# Patient Record
Sex: Female | Born: 1995
Health system: Southern US, Community
[De-identification: ages and names within clinical notes are randomized; demographics above are authoritative.]

## PROBLEM LIST (undated history)

## (undated) DIAGNOSIS — F419 Anxiety disorder, unspecified: Secondary | ICD-10-CM

## (undated) DIAGNOSIS — G43909 Migraine, unspecified, not intractable, without status migrainosus: Secondary | ICD-10-CM

## (undated) HISTORY — DX: Migraine, unspecified, not intractable, without status migrainosus: G43.909

## (undated) HISTORY — DX: Anxiety disorder, unspecified: F41.9

---
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
## 2016-10-27 ENCOUNTER — Encounter: Payer: Self-pay | Admitting: Certified Nurse Midwife

## 2016-10-27 ENCOUNTER — Ambulatory Visit (INDEPENDENT_AMBULATORY_CARE_PROVIDER_SITE_OTHER): Payer: Commercial Managed Care - HMO | Admitting: Certified Nurse Midwife

## 2016-10-27 VITALS — BP 130/81 | HR 107 | Ht 65.0 in | Wt 143.2 lb

## 2016-10-27 DIAGNOSIS — Z3041 Encounter for surveillance of contraceptive pills: Secondary | ICD-10-CM

## 2016-10-27 DIAGNOSIS — Z7689 Persons encountering health services in other specified circumstances: Secondary | ICD-10-CM

## 2016-10-27 MED ORDER — NORETHIN ACE-ETH ESTRAD-FE 1-20 MG-MCG PO TABS
1.0000 | ORAL_TABLET | Freq: Every day | ORAL | 3 refills | Status: DC
Start: 2016-10-27 — End: 2017-01-25

## 2016-10-27 NOTE — Patient Instructions (Signed)
Oral Contraception Use Oral contraceptive pills (OCPs) are medicines taken to prevent pregnancy. OCPs work by preventing the ovaries from releasing eggs. The hormones in OCPs also cause the cervical mucus to thicken, preventing the sperm from entering the uterus. The hormones also cause the uterine lining to become thin, not allowing a fertilized egg to attach to the inside of the uterus. OCPs are highly effective when taken exactly as prescribed. However, OCPs do not prevent sexually transmitted diseases (STDs). Safe sex practices, such as using condoms along with an OCP, can help prevent STDs. Before taking OCPs, you may have a physical exam and Pap test. Your health care provider may also order blood tests if necessary. Your health care provider will make sure you are a good candidate for oral contraception. Discuss with your health care provider the possible side effects of the OCP you may be prescribed. When starting an OCP, it can take 2 to 3 months for the body to adjust to the changes in hormone levels in your body. How to take oral contraceptive pills Your health care provider may advise you on how to start taking the first cycle of OCPs. Otherwise, you can:  Start on day 1 of your menstrual period. You will not need any backup contraceptive protection with this start time.  Start on the first Sunday after your menstrual period or the day you get your prescription. In these cases, you will need to use backup contraceptive protection for the first week.  Start the pill at any time of your cycle. If you take the pill within 5 days of the start of your period, you are protected against pregnancy right away. In this case, you will not need a backup form of birth control. If you start at any other time of your menstrual cycle, you will need to use another form of birth control for 7 days. If your OCP is the type called a minipill, it will protect you from pregnancy after taking it for 2 days (48  hours).  After you have started taking OCPs:  If you forget to take 1 pill, take it as soon as you remember. Take the next pill at the regular time.  If you miss 2 or more pills, call your health care provider because different pills have different instructions for missed doses. Use backup birth control until your next menstrual period starts.  If you use a 28-day pack that contains inactive pills and you miss 1 of the last 7 pills (pills with no hormones), it will not matter. Throw away the rest of the non-hormone pills and start a new pill pack.  No matter which day you start the OCP, you will always start a new pack on that same day of the week. Have an extra pack of OCPs and a backup contraceptive method available in case you miss some pills or lose your OCP pack. Follow these instructions at home:  Do not smoke.  Always use a condom to protect against STDs. OCPs do not protect against STDs.  Use a calendar to mark your menstrual period days.  Read the information and directions that came with your OCP. Talk to your health care provider if you have questions. Contact a health care provider if:  You develop nausea and vomiting.  You have abnormal vaginal discharge or bleeding.  You develop a rash.  You miss your menstrual period.  You are losing your hair.  You need treatment for mood swings or depression.  You   get dizzy when taking the OCP.  You develop acne from taking the OCP.  You become pregnant. Get help right away if:  You develop chest pain.  You develop shortness of breath.  You have an uncontrolled or severe headache.  You develop numbness or slurred speech.  You develop visual problems.  You develop pain, redness, and swelling in the legs. This information is not intended to replace advice given to you by your health care provider. Make sure you discuss any questions you have with your health care provider. Document Released: 09/28/2011 Document  Revised: 03/16/2016 Document Reviewed: 03/30/2013 Elsevier Interactive Patient Education  2017 Elsevier Inc.  

## 2016-10-27 NOTE — Progress Notes (Signed)
  GYN ENCOUNTER NOTE  Subjective:       Stacey Harrison is a 21 y.o. female is here for birth control refill.   Stacey Harrison graduated from [employer] with a degree in Elementary education in May 2017.   While a student and immediately prior to graduation, she received her care at the on campus health center.   Taking Junel Fe 1/20 and "likes her pill".    Gynecologic History Patient's last menstrual period was 09/26/2016 (exact date).   Contraception: OCP (estrogen/progesterone)   Last Pap: 01/2016. Results were: normal   Obstetric History OB History  No data available    History reviewed. No pertinent past medical history.  Past Surgical History:  Procedure Laterality Date  . [place] EXTRACTION  2014    No current outpatient prescriptions on file prior to visit.   No current facility-administered medications on file prior to visit.     Not on File  Social History   Social History  . Marital status: Married    Spouse name: N/A  . Number of children: N/A  . Years of education: N/A   Occupational History  . Not on file.   Social History Main Topics  . Smoking status: Never Smoker  . Smokeless tobacco: Never Used  . Alcohol use Yes     Comment: rarely  . Drug use: No  . Sexual activity: Yes    Birth control/ protection: Pill   Other Topics Concern  . Not on file   Social History Narrative  . No narrative on file    Family History  Problem Relation Age of Onset  . Breast cancer Maternal Grandmother     The following portions of the patient's history were reviewed and updated as appropriate: allergies, current medications, past family history, past medical history, past social history, past surgical history and problem list.  Review of Systems Review of Systems - negative  Objective:   BP 130/81   Pulse (!) 107   Ht 5\' 5"  (1.651 m)   Wt 143 lb 3 oz (64.9 kg)   LMP 09/26/2016 (Exact Date)   BMI 23.83 kg/m    NECK: Normal range of  motion, supple, no masses.  Normal thyroid.   CARDIOVASCULAR: Regular rate and rhythm   RESPIRATORY: Clear bilaterally  Assessment:   1. Encounter for surveillance of contraceptive pills   2. Encounter to establish care    Plan:   1.  Reviewed risk and benefits of OCPs and red flag symptoms like abdominal pain, chest pain, headache, eye problems, or severe leg pain and when to notify.   2. Refill Junel 1/20 Fe, 1 tablet PO daily, refills 3  3. RTC in April 2018 for AE or sooner if needed.    Diona Fanti, CNM  ROS

## 2017-01-25 ENCOUNTER — Encounter: Payer: Self-pay | Admitting: Certified Nurse Midwife

## 2017-01-25 ENCOUNTER — Ambulatory Visit (INDEPENDENT_AMBULATORY_CARE_PROVIDER_SITE_OTHER): Payer: Commercial Managed Care - HMO | Admitting: Certified Nurse Midwife

## 2017-01-25 VITALS — BP 124/81 | HR 91 | Ht 66.5 in | Wt 143.9 lb

## 2017-01-25 DIAGNOSIS — Z01419 Encounter for gynecological examination (general) (routine) without abnormal findings: Secondary | ICD-10-CM | POA: Diagnosis not present

## 2017-01-25 MED ORDER — NORETHIN ACE-ETH ESTRAD-FE 1-20 MG-MCG PO TABS: 1.0000 | ORAL_TABLET | Freq: Every day | ORAL | 3 refills | Status: DC

## 2017-01-25 NOTE — Patient Instructions (Signed)
Preventive Care 18-39 Years, Female Preventive care refers to lifestyle choices and visits with your health care provider that can promote health and wellness. What does preventive care include?  A yearly physical exam. This is also called an annual well check.  Dental exams once or twice a year.  Routine eye exams. Ask your health care provider how often you should have your eyes checked.  Personal lifestyle choices, including:  Daily care of your teeth and gums.  Regular physical activity.  Eating a healthy diet.  Avoiding tobacco and drug use.  Limiting alcohol use.  Practicing safe sex.  Taking vitamin and mineral supplements as recommended by your health care provider. What happens during an annual well check? The services and screenings done by your health care provider during your annual well check will depend on your age, overall health, lifestyle risk factors, and family history of disease. Counseling  Your health care provider may ask you questions about your:  Alcohol use.  Tobacco use.  Drug use.  Emotional well-being.  Home and relationship well-being.  Sexual activity.  Eating habits.  Work and work environment.  Method of birth control.  Menstrual cycle.  Pregnancy history. Screening  You may have the following tests or measurements:  Height, weight, and BMI.  Diabetes screening. This is done by checking your blood sugar (glucose) after you have not eaten for a while (fasting).  Blood pressure.  Lipid and cholesterol levels. These may be checked every 5 years starting at age 20.  Skin check.  Hepatitis C blood test.  Hepatitis B blood test.  Sexually transmitted disease (STD) testing.  BRCA-related cancer screening. This may be done if you have a family history of breast, ovarian, tubal, or peritoneal cancers.  Pelvic exam and Pap test. This may be done every 3 years starting at age 21. Starting at age 30, this may be done every 5  years if you have a Pap test in combination with an HPV test. Discuss your test results, treatment options, and if necessary, the need for more tests with your health care provider. Vaccines  Your health care provider may recommend certain vaccines, such as:  Influenza vaccine. This is recommended every year.  Tetanus, diphtheria, and acellular pertussis (Tdap, Td) vaccine. You may need a Td booster every 10 years.  Varicella vaccine. You may need this if you have not been vaccinated.  HPV vaccine. If you are 26 or younger, you may need three doses over 6 months.  Measles, mumps, and rubella (MMR) vaccine. You may need at least one dose of MMR. You may also need a second dose.  Pneumococcal 13-valent conjugate (PCV13) vaccine. You may need this if you have certain conditions and were not previously vaccinated.  Pneumococcal polysaccharide (PPSV23) vaccine. You may need one or two doses if you smoke cigarettes or if you have certain conditions.  Meningococcal vaccine. One dose is recommended if you are age 19-21 years and a first-year college student living in a residence hall, or if you have one of several medical conditions. You may also need additional booster doses.  Hepatitis A vaccine. You may need this if you have certain conditions or if you travel or work in places where you may be exposed to hepatitis A.  Hepatitis B vaccine. You may need this if you have certain conditions or if you travel or work in places where you may be exposed to hepatitis B.  Haemophilus influenzae type b (Hib) vaccine. You may need this   you have certain risk factors.  Talk to your health care provider about which screenings and vaccines you need and how often you need them. This information is not intended to replace advice given to you by your health care provider. Make sure you discuss any questions you have with your health care provider. Document Released: 12/05/2001 Document Revised: 06/28/2016  Document Reviewed: 08/10/2015 Elsevier Interactive Patient Education  2017 Elsevier Inc.  Ethinyl Estradiol; Norethindrone Acetate; Ferrous fumarate tablets or capsules What is this medicine? ETHINYL ESTRADIOL; NORETHINDRONE ACETATE; FERROUS FUMARATE (ETH in il es tra DYE ole; nor eth IN drone AS e tate; FER us FUE ma rate) is an oral contraceptive. The products combine two types of female hormones, an estrogen and a progestin. They are used to prevent ovulation and pregnancy. Some products are also used to treat acne in females. This medicine may be used for other purposes; ask your health care provider or pharmacist if you have questions. COMMON BRAND NAME(S): Blisovi 24 Fe, Blisovi Fe, Estrostep Fe, Gildess 24 Fe, Gildess Fe 1.5/30, Gildess Fe 1/20, Junel Fe 1.5/30, Junel Fe 1/20, Junel Fe 24, Larin Fe, Lo Loestrin Fe, Loestrin 24 Fe, Loestrin FE 1.5/30, Loestrin FE 1/20, Lomedia 24 Fe, Microgestin 24 Fe, Microgestin Fe 1.5/30, Microgestin Fe 1/20, Tarina Fe 1/20, Taytulla, Tilia Fe, Tri-Legest Fe What should I tell my health care provider before I take this medicine? They need to know if you have any of these conditions: -abnormal vaginal bleeding -blood vessel disease -breast, cervical, endometrial, ovarian, liver, or uterine cancer -diabetes -gallbladder disease -heart disease or recent heart attack -high blood pressure -high cholesterol -history of blood clots -kidney disease -liver disease -migraine headaches -smoke tobacco -stroke -systemic lupus erythematosus (SLE) -an unusual or allergic reaction to estrogens, progestins, other medicines, foods, dyes, or preservatives -pregnant or trying to get pregnant -breast-feeding How should I use this medicine? Take this medicine by mouth. To reduce nausea, this medicine may be taken with food. Follow the directions on the prescription label. Take this medicine at the same time each day and in the order directed on the package. Do not  take your medicine more often than directed. A patient package insert for the product will be given with each prescription and refill. Read this sheet carefully each time. The sheet may change frequently. Contact your pediatrician regarding the use of this medicine in children. Special care may be needed. This medicine has been used in female children who have started having menstrual periods. Overdosage: If you think you have taken too much of this medicine contact a poison control center or emergency room at once. NOTE: This medicine is only for you. Do not share this medicine with others. What if I miss a dose? If you miss a dose, refer to the patient information sheet you received with your medicine for direction. If you miss more than one pill, this medicine may not be as effective and you may need to use another form of birth control. What may interact with this medicine? Do not take this medicine with the following medication: -dasabuvir; ombitasvir; paritaprevir; ritonavir -ombitasvir; paritaprevir; ritonavir This medicine may also interact with the following medications: -acetaminophen -antibiotics or medicines for infections, especially rifampin, rifabutin, rifapentine, and griseofulvin, and possibly penicillins or tetracyclines -aprepitant -ascorbic acid (vitamin C) -atorvastatin -barbiturate medicines, such as phenobarbital -bosentan -carbamazepine -caffeine -clofibrate -cyclosporine -dantrolene -doxercalciferol -felbamate -grapefruit juice -hydrocortisone -medicines for anxiety or sleeping problems, such as diazepam or temazepam -medicines for diabetes, including pioglitazone -mineral   oil -modafinil -mycophenolate -nefazodone -oxcarbazepine -phenytoin -prednisolone -ritonavir or other medicines for HIV infection or AIDS -rosuvastatin -selegiline -soy isoflavones supplements -St. John's wort -tamoxifen or raloxifene -theophylline -thyroid  hormones -topiramate -warfarin This list may not describe all possible interactions. Give your health care provider a list of all the medicines, herbs, non-prescription drugs, or dietary supplements you use. Also tell them if you smoke, drink alcohol, or use illegal drugs. Some items may interact with your medicine. What should I watch for while using this medicine? Visit your doctor or health care professional for regular checks on your progress. You will need a regular breast and pelvic exam and Pap smear while on this medicine. Use an additional method of contraception during the first cycle that you take these tablets. If you have any reason to think you are pregnant, stop taking this medicine right away and contact your doctor or health care professional. If you are taking this medicine for hormone related problems, it may take several cycles of use to see improvement in your condition. Smoking increases the risk of getting a blood clot or having a stroke while you are taking birth control pills, especially if you are more than 23 years old. You are strongly advised not to smoke. This medicine can make your body retain fluid, making your fingers, hands, or ankles swell. Your blood pressure can go up. Contact your doctor or health care professional if you feel you are retaining fluid. This medicine can make you more sensitive to the sun. Keep out of the sun. If you cannot avoid being in the sun, wear protective clothing and use sunscreen. Do not use sun lamps or tanning beds/booths. If you wear contact lenses and notice visual changes, or if the lenses begin to feel uncomfortable, consult your eye care specialist. In some women, tenderness, swelling, or minor bleeding of the gums may occur. Notify your dentist if this happens. Brushing and flossing your teeth regularly may help limit this. See your dentist regularly and inform your dentist of the medicines you are taking. If you are going to have  elective surgery, you may need to stop taking this medicine before the surgery. Consult your health care professional for advice. This medicine does not protect you against HIV infection (AIDS) or any other sexually transmitted diseases. What side effects may I notice from receiving this medicine? Side effects that you should report to your doctor or health care professional as soon as possible: -allergic reactions like skin rash, itching or hives, swelling of the face, lips, or tongue -breast tissue changes or discharge -changes in vaginal bleeding during your period or between your periods -changes in vision -chest pain -confusion -coughing up blood -dizziness -feeling faint or lightheaded -headaches or migraines -leg, arm or groin pain -loss of balance or coordination -severe or sudden headaches -stomach pain (severe) -sudden shortness of breath -sudden numbness or weakness of the face, arm or leg -symptoms of vaginal infection like itching, irritation or unusual discharge -tenderness in the upper abdomen -trouble speaking or understanding -vomiting -yellowing of the eyes or skin Side effects that usually do not require medical attention (report to your doctor or health care professional if they continue or are bothersome): -breakthrough bleeding and spotting that continues beyond the 3 initial cycles of pills -breast tenderness -mood changes, anxiety, depression, frustration, anger, or emotional outbursts -increased sensitivity to sun or ultraviolet light -nausea -skin rash, acne, or brown spots on the skin -weight gain (slight) This list may not describe all   possible side effects. Call your doctor for medical advice about side effects. You may report side effects to FDA at 1-800-FDA-1088. Where should I keep my medicine? Keep out of the reach of children. Store at room temperature between 15 and 30 degrees C (59 and 86 degrees F). Throw away any unused medicine after the  expiration date. NOTE: This sheet is a summary. It may not cover all possible information. If you have questions about this medicine, talk to your doctor, pharmacist, or health care provider.  2018 Elsevier/Gold Standard (2016-06-19 08:04:41)  

## 2017-01-25 NOTE — Progress Notes (Signed)
ANNUAL PREVENTATIVE CARE GYN  ENCOUNTER NOTE  Subjective:       Stacey Harrison is a 21 y.o.  female here for a routine annual gynecologic exam.    She is doing well and would like to continue her current birth control pill.   Denies difficulty breathing or respiratory distress, chest pain, abdominal pain, unexplained vaginal bleeding, and leg pain or swelling.    Gynecologic History Patient's last menstrual period was 01/25/2017 (exact date).   Contraception: OCP (estrogen/progesterone)   Last Pap: 01/2016. Results were: normal   Past Surgical History:  Procedure Laterality Date  . [place] EXTRACTION  2014    No current outpatient prescriptions on file prior to visit.   No current facility-administered medications on file prior to visit.     No Known Allergies  Social History   Social History  . Marital status: Married    Spouse name: N/A  . Number of children: N/A  . Years of education: N/A   Occupational History  . Not on file.   Social History Main Topics  . Smoking status: Never Smoker  . Smokeless tobacco: Never Used  . Alcohol use Yes     Comment: rarely  . Drug use: No  . Sexual activity: Yes    Birth control/ protection: Pill   Other Topics Concern  . Not on file   Social History Narrative  . No narrative on file    Family History  Problem Relation Age of Onset  . Breast cancer Maternal Grandmother     The following portions of the patient's history were reviewed and updated as appropriate: allergies, current medications, past family history, past medical history, past social history, past surgical history and problem list.  Review of Systems  ROS negative except as noted above. Information obtained from patient.    Objective:   BP 124/81   Pulse 91   Ht 5' 6.5" (1.689 m)   Wt 143 lb 14.4 oz (65.3 kg)   LMP 01/25/2017 (Exact Date)   BMI 22.88 kg/m    CONSTITUTIONAL: Well-developed, well-nourished female in no acute  distress.   PSYCHIATRIC: Normal mood and affect. Normal behavior. Normal judgment and thought content.  [place]: Alert and oriented to person, place, and time. Normal muscle tone coordination. No cranial nerve deficit noted.  HENT:  Normocephalic, atraumatic, External right and left ear normal. Oropharynx is clear and moist  EYES: Conjunctivae and EOM are normal. Pupils are equal, round, and reactive to light. No scleral icterus.   NECK: Normal range of motion, supple, no masses.  Normal thyroid.   SKIN: Skin is warm and dry. No rash noted. Not diaphoretic. No erythema. No pallor.  CARDIOVASCULAR: Normal heart rate noted, regular rhythm, no murmur.  RESPIRATORY: Clear to auscultation bilaterally. Effort and breath sounds normal, no problems with respiration noted.  BREASTS: Symmetric in size. No masses, skin changes, nipple drainage, or lymphadenopathy.  ABDOMEN: Soft, normal bowel sounds, no distention noted.  No tenderness, rebound or guarding.   BLADDER: Normal  PELVIC:  External Genitalia: Normal  Vagina: Normal  Cervix: Normal  Uterus: Normal  Adnexa: Normal   MUSCULOSKELETAL: Normal range of motion. No tenderness.  No cyanosis, clubbing, or edema.  2+ distal pulses.  LYMPHATIC: No Axillary, Supraclavicular, or Inguinal Adenopathy.  Assessment:   Annual gynecologic examination 21 y.o.   Contraception: OCP (estrogen/progesterone)   Normal BMI Problem List Items Addressed This Visit    None    Visit Diagnoses  Encounter for annual routine gynecological examination    -  Primary      Plan:   Pap: Not needed  Labs: Declined   Routine preventative health maintenance measures emphasized: Alcohol/Substance use risks, Stress Management and Peer Pressure Issues  Return to Clinic - [place], CNM

## 2017-03-21 ENCOUNTER — Encounter: Payer: Self-pay | Admitting: Certified Nurse Midwife

## 2017-03-21 ENCOUNTER — Other Ambulatory Visit: Payer: Self-pay

## 2017-03-21 DIAGNOSIS — Z3041 Encounter for surveillance of contraceptive pills: Secondary | ICD-10-CM

## 2017-03-21 MED ORDER — NORETHIN ACE-ETH ESTRAD-FE 1-20 MG-MCG PO TABS: 1.0000 | ORAL_TABLET | Freq: Every day | ORAL | 3 refills | Status: DC

## 2017-08-02 DIAGNOSIS — Z23 Encounter for immunization: Secondary | ICD-10-CM | POA: Diagnosis not present

## 2017-08-09 ENCOUNTER — Encounter: Payer: Commercial Managed Care - HMO | Admitting: Certified Nurse Midwife

## 2017-09-05 DIAGNOSIS — K649 Unspecified hemorrhoids: Secondary | ICD-10-CM | POA: Diagnosis not present

## 2017-09-05 DIAGNOSIS — R51 Headache: Secondary | ICD-10-CM | POA: Diagnosis not present

## 2017-09-05 DIAGNOSIS — Z1211 Encounter for screening for malignant neoplasm of colon: Secondary | ICD-10-CM | POA: Diagnosis not present

## 2017-09-28 ENCOUNTER — Emergency Department
Admission: EM | Admit: 2017-09-28 | Discharge: 2017-09-28 | Disposition: A | Discharge status: Home / Self Care | Payer: 59 | Attending: Emergency Medicine | Admitting: Emergency Medicine

## 2017-09-28 ENCOUNTER — Emergency Department: Payer: 59

## 2017-09-28 DIAGNOSIS — Z7982 Long term (current) use of aspirin: Secondary | ICD-10-CM | POA: Insufficient documentation

## 2017-09-28 DIAGNOSIS — Z79899 Other long term (current) drug therapy: Secondary | ICD-10-CM | POA: Diagnosis not present

## 2017-09-28 DIAGNOSIS — R29810 Facial weakness: Secondary | ICD-10-CM

## 2017-09-28 DIAGNOSIS — R531 Weakness: Secondary | ICD-10-CM | POA: Diagnosis not present

## 2017-09-28 DIAGNOSIS — R29818 Other symptoms and signs involving the nervous system: Secondary | ICD-10-CM | POA: Diagnosis not present

## 2017-09-28 DIAGNOSIS — R200 Anesthesia of skin: Secondary | ICD-10-CM | POA: Diagnosis not present

## 2017-09-28 LAB — DIFFERENTIAL
Basophils Absolute: 0 10*3/uL (ref 0–0.1)
Basophils Relative: 0 %
EOS PCT: 0 %
Eosinophils Absolute: 0 10*3/uL (ref 0–0.7)
LYMPHS ABS: 1.5 10*3/uL (ref 1.0–3.6)
LYMPHS PCT: 23 %
MONO ABS: 0.4 10*3/uL (ref 0.2–0.9)
Monocytes Relative: 6 %
Neutro Abs: 4.7 10*3/uL (ref 1.4–6.5)
Neutrophils Relative %: 71 %

## 2017-09-28 LAB — COMPREHENSIVE METABOLIC PANEL
ALK PHOS: 55 U/L (ref 38–126)
ALT: 22 U/L (ref 14–54)
ANION GAP: 12 (ref 5–15)
AST: 29 U/L (ref 15–41)
Albumin: 4.4 g/dL (ref 3.5–5.0)
BILIRUBIN TOTAL: 0.5 mg/dL (ref 0.3–1.2)
BUN: 10 mg/dL (ref 6–20)
CALCIUM: 9.7 mg/dL (ref 8.9–10.3)
CO2: 23 mmol/L (ref 22–32)
CREATININE: 0.59 mg/dL (ref 0.44–1.00)
Chloride: 104 mmol/L (ref 101–111)
GFR calc non Af Amer: >60 mL/min (ref >60)
GLUCOSE: 103 mg/dL — AB (ref 65–99)
Potassium: 3.8 mmol/L (ref 3.5–5.1)
Sodium: 139 mmol/L (ref 135–145)
TOTAL PROTEIN: 7.7 g/dL (ref 6.5–8.1)

## 2017-09-28 LAB — CBC
HCT: 38.7 % (ref 35.0–47.0)
HEMOGLOBIN: 12.9 g/dL (ref 12.0–16.0)
MCH: 28.3 pg (ref 26.0–34.0)
MCHC: 33.4 g/dL (ref 32.0–36.0)
MCV: 84.7 fL (ref 80.0–100.0)
Platelets: 240 10*3/uL (ref 150–440)
RBC: 4.57 MIL/uL (ref 3.80–5.20)
RDW: 13.4 % (ref 11.5–14.5)
WBC: 6.6 10*3/uL (ref 3.6–11.0)

## 2017-09-28 LAB — POCT PREGNANCY, URINE: PREG TEST UR: NEGATIVE

## 2017-09-28 LAB — PROTIME-INR
INR: 0.95
Prothrombin Time: 12.6 seconds (ref 11.4–15.2)

## 2017-09-28 LAB — APTT: aPTT: 27 seconds (ref 24–36)

## 2017-09-28 LAB — TROPONIN I: Troponin I: <0.03 ng/mL (ref <0.03)

## 2017-09-28 LAB — GLUCOSE, CAPILLARY: Glucose-Capillary: 87 mg/dL (ref 65–99)

## 2017-09-28 MED ORDER — ASPIRIN 81 MG PO CHEW
324.0000 mg | CHEWABLE_TABLET | Freq: Once | ORAL | Status: AC
  Administered 2017-09-28: 324 mg via ORAL
  Filled 2017-09-28: qty 4

## 2017-09-28 MED ORDER — ASPIRIN 81 MG PO TABS: 81.0000 mg | ORAL_TABLET | Freq: Every day | ORAL | 0 refills | Status: DC

## 2017-09-28 MED ORDER — VALACYCLOVIR HCL 1 G PO TABS: 1000.0000 mg | ORAL_TABLET | Freq: Three times a day (TID) | ORAL | 0 refills | Status: AC

## 2017-09-28 MED ORDER — PREDNISONE 20 MG PO TABS: 60.0000 mg | ORAL_TABLET | Freq: Every day | ORAL | 0 refills | Status: AC

## 2017-09-28 NOTE — Discharge Instructions (Signed)
As I explained to you, I am not sure your symptoms were from a mild resolved stroke or early Bell's palsy. Please take one daily baby aspirin and follow up with your doctor as soon as possible for further evaluation. If you have worsening facial droop or numbness, slurred speech, difficulty finding words, unilateral weakness or numbness, difficulty walking please return to the ER emergently for further evaluation.   If this is early Bell's palsy you will noticed worsening facial droop involving one entire side of your face where you will be unable to fully close your eyes. If these symptoms develop you should return to the ER for evaluation. You may then fill the prescription for prednisone and valacyclovir for management of the Bell's palsy. The most important thing is eye care. Make sure to tape your eye close and place a shield over it at night time for sleeping and durring the day apply saline drops in the eye every hour to avoid corneal injury.

## 2017-09-28 NOTE — ED Triage Notes (Signed)
Patient presents to the ED with right sided facial droop since 8:15am.  Patient denies any weakness in arms and legs, denies difficulty speaking or confusion.  Patient denies any unilateral weakness.  Grip strength is equal.  Patient denies dizziness or headache.

## 2017-09-28 NOTE — Consult Note (Signed)
Referring Physician: Alfred Levins    Chief Complaint: Right facial droop and numbness  HPI: Stacey Harrison is an 21 y.o. female who reports that she awakened at baseline.  At about 0815 noted tingling on the right side of her face.  Went to the [employer] and felt as id her face was drawn to the right.  BP taken and was elevated.  Patient went to the clinic at that time where her BP was normal.  Patient was sent to the ED from there.  Initial NIHSS of 2. Patient with no associated change in taste, ear fullness, eye tearing, or headache.   Patient with a history of migraines.  No history of focal symptoms with headaches.       Date last known well: Date: 09/28/2017 Time last known well: Time: 08:15 tPA Given: No: Improving symptoms  Past medical history: Mirgaine.  Past Surgical History:  Procedure Laterality Date  . WISDOM TOOTH EXTRACTION  2014    Family History  Problem Relation Age of Onset  . Breast cancer Maternal Grandmother   Mother with migraine  Social History:  reports that  has never smoked. she has never used smokeless tobacco. She reports that she drinks alcohol. She reports that she does not use drugs.  Allergies: No Known Allergies  Medications: BCP  ROS: History obtained from the patient  General ROS: negative for - chills, fatigue, fever, night sweats, weight gain or weight loss Psychological ROS: negative for - behavioral disorder, hallucinations, memory difficulties, mood swings or suicidal ideation Ophthalmic ROS: negative for - blurry vision, double vision, eye pain or loss of vision ENT ROS: negative for - epistaxis, nasal discharge, oral lesions, sore throat, tinnitus or vertigo Allergy and Immunology ROS: negative for - hives or itchy/watery eyes Hematological and Lymphatic ROS: negative for - bleeding problems, bruising or swollen lymph nodes Endocrine ROS: negative for - galactorrhea, hair pattern changes, polydipsia/polyuria or temperature  intolerance Respiratory ROS: morning congestion Cardiovascular ROS: negative for - chest pain, dyspnea on exertion, edema or irregular heartbeat Gastrointestinal ROS: negative for - abdominal pain, diarrhea, hematemesis, nausea/vomiting or stool incontinence Genito-Urinary ROS: negative for - dysuria, hematuria, incontinence or urinary frequency/urgency Musculoskeletal ROS: negative for - joint swelling or muscular weakness Neurological ROS: as noted in HPI Dermatological ROS: negative for rash and skin lesion changes  Physical Examination: Blood pressure (!) 140/94, pulse (!) 109, temperature 98.1 F (36.7 C), temperature source Oral, resp. rate 16, SpO2 100 %.  HEENT-  Normocephalic, no lesions, without obvious abnormality.  Normal external eye and conjunctiva.  Normal TM's bilaterally.  Normal auditory canals and external ears. Normal external nose, mucus membranes and septum.  Normal pharynx. Cardiovascular- S1, S2 normal, pulses palpable throughout   Lungs- chest clear, no wheezing, rales, normal symmetric air entry Abdomen- soft, non-tender; bowel sounds normal; no masses,  no organomegaly Extremities- no edema Lymph-no adenopathy palpable Musculoskeletal-no joint tenderness, deformity or swelling Skin-warm and dry, no hyperpigmentation, vitiligo, or suspicious lesions  Neurological Examination   Mental Status: Alert, oriented, thought content appropriate.  Speech fluent without evidence of aphasia.  Able to follow 3 step commands without difficulty. Cranial Nerves: II: Discs flat bilaterally; Visual fields grossly normal, pupils equal, round, reactive to light and accommodation III,IV, VI: ptosis not present, extra-ocular motions intact bilaterally V,VII: smile symmetric, facial light touch sensation decreased on the right VIII: hearing normal bilaterally IX,X: gag reflex present XI: bilateral shoulder shrug XII: midline tongue extension Motor: Right : Upper extremity  5/5    Left:     Upper extremity   5/5  Lower extremity   5/5     Lower extremity   5/5 Tone and bulk:normal tone throughout; no atrophy noted Sensory: Pinprick and light touch intact throughout, bilaterally Deep Tendon Reflexes: 2+ and symmetric throughout Plantars: Right: downgoing   Left: downgoing Cerebellar: normal finger-to-nose and normal heel-to-shin test Gait: normal gait and station    Laboratory Studies:  Basic Metabolic Panel: No results for input(s): NA, K, CL, CO2, GLUCOSE, BUN, CREATININE, CALCIUM, MG, PHOS in the last 168 hours.  Liver Function Tests: No results for input(s): AST, ALT, ALKPHOS, BILITOT, PROT, ALBUMIN in the last 168 hours. No results for input(s): LIPASE, AMYLASE in the last 168 hours. No results for input(s): AMMONIA in the last 168 hours.  CBC: Recent Labs  Lab 09/28/17 1039  WBC 6.6  NEUTROABS 4.7  HGB 12.9  HCT 38.7  MCV 84.7  PLT 240    Cardiac Enzymes: No results for input(s): CKTOTAL, CKMB, CKMBINDEX, TROPONINI in the last 168 hours.  BNP: Invalid input(s): POCBNP  CBG: Recent Labs  Lab 09/28/17 1049  GLUCAP 87    Microbiology: No results found for this or any previous visit.  Coagulation Studies: No results for input(s): LABPROT, INR in the last 72 hours.  Urinalysis: No results for input(s): COLORURINE, LABSPEC, PHURINE, GLUCOSEU, HGBUR, BILIRUBINUR, KETONESUR, PROTEINUR, UROBILINOGEN, NITRITE, LEUKOCYTESUR in the last 168 hours.  Invalid input(s): APPERANCEUR  Lipid Panel: No results found for: CHOL, TRIG, HDL, CHOLHDL, VLDL, LDLCALC  HgbA1C: No results found for: HGBA1C  Urine Drug Screen:  No results found for: LABOPIA, COCAINSCRNUR, LABBENZ, AMPHETMU, THCU, LABBARB  Alcohol Level: No results for input(s): ETH in the last 168 hours.   Imaging: Ct Head Code Stroke Wo Contrast  Result Date: 09/28/2017 CLINICAL DATA:  Code stroke. 21 year old female with right side facial droop since 0815 hours. EXAM: CT  HEAD WITHOUT CONTRAST TECHNIQUE: Contiguous axial images were obtained from the base of the skull through the vertex without intravenous contrast. COMPARISON:  None. FINDINGS: Brain: Normal cerebral volume. No midline shift, ventriculomegaly, mass effect, evidence of mass lesion, intracranial hemorrhage or evidence of cortically based acute infarction. Gray-white matter differentiation is within normal limits throughout the brain. Vascular: No suspicious intracranial vascular hyperdensity. Skull: Negative.  No acute osseous abnormality identified. Sinuses/Orbits: Clear.  Bilateral tympanic cavities are clear. Other: Visualized orbits and scalp soft tissues are within normal limits. ASPECTS [facility] Stroke Program Early CT Score) - Ganglionic level infarction (caudate, lentiform nuclei, internal capsule, insula, M1-M3 cortex): 7 - Supraganglionic infarction (M4-M6 cortex): 3 Total score (0-10 with 10 being normal): 10 IMPRESSION: 1. Normal noncontrast CT appearance of the brain. 2. ASPECTS is 10. 3. Normal noncontrast head CT. Electronically Signed   By: Genevie Ann M.D.   On: 09/28/2017 10:59    Assessment: 21 y.o. female presenting with right facial numbness and weakness.  Symptoms improved although not completely resolved by the time of my evaluation.  Head CT reviewed and shows no acute changes.  Patient on BCP.  Can not rule out early Bell's palsy, migraine equivalent versus small acute infarct.  Further work up recommended.    Stroke Risk Factors - none  Plan: 1. HgbA1c, fasting lipid panel 2. MRI of the brain without contrast.  If MRI of the brain unremarkable no further stroke work up indicated at this time.  Patient to start 81mg  daily and follow up with her outpatient MD.  If  acute infarct noted patient to be admitted for stroke work up.    3. NPO until RN stroke swallow screen 4. Telemetry monitoring 5. Frequent neuro checks  Case discussed with Dr. Unk Lightning,  MD Neurology [phone] 09/28/2017, 11:19 AM

## 2017-09-28 NOTE — ED Provider Notes (Signed)
[facility] Emergency Department Provider Note  ____________________________________________  Time seen: Approximately 12:02 PM  I have reviewed the triage vital signs and the nursing notes.   HISTORY  Chief Complaint Facial Droop   HPI Stacey Harrison is a 21 y.o. female with no significant past medical history who presents for evaluation of right-sided facial droop. Patient reports that she was working this morning when she started noticing numbness on the right side of her face. One of her colleagues noted that she had a slight right facial droop which prompted her visit to the emergency room. Last normal at 8:15 AM. No personal history of stroke. No family history of stroke. Patient does not smoke. She does take birth control pill. She denies headache, slurred speech, unilateral weakness or numbness, difficulty finding words, vertigo, changes in vision. No prior history of Bell's palsy, no tick bites, no history of cold sores.  No past medical history on file.  There are no active problems to display for this patient.   Past Surgical History:  Procedure Laterality Date  . [place] EXTRACTION  2014    Prior to Admission medications   Medication Sig Start Date End Date Taking? Authorizing Provider  norethindrone-ethinyl estradiol (JUNEL FE 1/20) 1-20 MG-MCG tablet Take 1 tablet by mouth daily. 03/21/17 03/16/18 Yes Lawhorn, Lara Mulch, CNM  aspirin 81 MG tablet Take 1 tablet (81 mg total) by mouth daily. 09/28/17   Rudene Re, MD  Butalbital-APAP-Caffeine [phone] MG capsule Take 1-2 capsules by mouth as needed. Take 2 capsules by mouth at onset of headache - may then take 1 capsule every 6 hours as needed (max 6 capsules per 24 hours) 09/09/17   [provider]  ondansetron (ZOFRAN-ODT) 8 MG disintegrating tablet Take 8 mg by mouth every 8 (eight) hours as needed. for nausea 09/05/17   [provider]    pramoxine-hydrocortisone (PROCTOCREAM-HC) 1-1 % rectal cream Place 1 application rectally 2 (two) times daily. 09/05/17   [provider]  predniSONE (DELTASONE) 20 MG tablet Take 3 tablets (60 mg total) by mouth daily for 7 days. 09/28/17 10/05/17  Rudene Re, MD  valACYclovir (VALTREX) 1000 MG tablet Take 1 tablet (1,000 mg total) by mouth 3 (three) times daily for 7 days. 09/28/17 10/05/17  Rudene Re, MD    Allergies Patient has no known allergies.  Family History  Problem Relation Age of Onset  . Breast cancer Maternal Grandmother     Social History Social History   Tobacco Use  . Smoking status: Never Smoker  . Smokeless tobacco: Never Used  Substance Use Topics  . Alcohol use: Yes    Comment: rarely  . Drug use: No    Review of Systems  Constitutional: Negative for fever. Eyes: Negative for visual changes. ENT: Negative for sore throat. Neck: No neck pain  Cardiovascular: Negative for chest pain. Respiratory: Negative for shortness of breath. Gastrointestinal: Negative for abdominal pain, vomiting or diarrhea. Genitourinary: Negative for dysuria. Musculoskeletal: Negative for back pain. Skin: Negative for rash. Neurological: Negative for headaches. + R facial numbness and droop Psych: No SI or HI  ____________________________________________   PHYSICAL EXAM:  VITAL SIGNS: ED Triage Vitals [09/28/17 1059]  Enc Vitals Group     BP (!) 140/94     Pulse Rate (!) 109     Resp 16     Temp 98.1 F (36.7 C)     Temp Source Oral     SpO2 100 %  Weight      Height      Head Circumference      Peak Flow      Pain Score      Pain Loc      Pain Edu?      Excl. in [place]?     Constitutional: Alert and oriented. Well appearing and in no apparent distress. HEENT:      Head: Normocephalic and atraumatic.         Eyes: Conjunctivae are normal. Sclera is non-icteric.       Mouth/Throat: Mucous membranes are moist.       Neck: Supple  with no signs of meningismus. Cardiovascular: Regular rate and rhythm. No murmurs, gallops, or rubs. 2+ symmetrical distal pulses are present in all extremities. No JVD. Respiratory: Normal respiratory effort. Lungs are clear to auscultation bilaterally. No wheezes, crackles, or rhonchi.  Gastrointestinal: Soft, non tender, and non distended with positive bowel sounds. No rebound or guarding. Genitourinary: No CVA tenderness. Musculoskeletal: Nontender with normal range of motion in all extremities. No edema, cyanosis, or erythema of extremities. Neurologic: Normal speech and language. A & O x3, PERRL, EOMI, no nystagmus, CN II-XII intact, motor testing reveals good tone and bulk throughout. There is no evidence of pronator drift or dysmetria. Muscle strength is 5/5 throughout.  Sensory examination shows slight decrease sensation to touch on the R upper and lower face. Gait is normal. Skin: Skin is warm, dry and intact. No rash noted. Psychiatric: Mood and affect are normal. Speech and behavior are normal.   NIH Stroke Scale  Interval: Baseline Time: 1:45 PM Person Administering Scale: [place] stroke scale items in the order listed. Record performance in each category after each subscale exam. Do not go back and change scores. Follow directions provided for each exam technique. Scores should reflect what the patient does, not what the clinician thinks the patient can do. The clinician should record answers while administering the exam and work quickly. Except where indicated, the patient should not be coached (i.e., repeated requests to patient to make a special effort).   1a  Level of consciousness: 0=alert; keenly responsive  1b. LOC questions:  0=Performs both tasks correctly  1c. LOC commands: 0=Performs both tasks correctly  2.  Best Gaze: 0=normal  3.  Visual: 0=No visual loss  4. Facial Palsy: 0=Normal symmetric movement  5a.  Motor left arm: 0=No drift, limb  holds 90 (or 45) degrees for full 10 seconds  5b.  Motor right arm: 0=No drift, limb holds 90 (or 45) degrees for full 10 seconds  6a. motor left leg: 0=No drift, limb holds 90 (or 45) degrees for full 10 seconds  6b  Motor right leg:  0=No drift, limb holds 90 (or 45) degrees for full 10 seconds  7. Limb Ataxia: 0=Absent  8.  Sensory: 1=Mild to moderate sensory loss; patient feels pinprick is less sharp or is dull on the affected side; there is a loss of superficial pain with pinprick but patient is aware She is being touched  9. Best Language:  0=No aphasia, normal  10. Dysarthria: 0=Normal  11. Extinction and Inattention: 0=No abnormality   Total:   1    ____________________________________________   LABS (all labs ordered are listed, but only abnormal results are displayed)  Labs Reviewed  COMPREHENSIVE METABOLIC PANEL - Abnormal; Notable for the following components:      Result Value   Glucose, Bld 103 (*)    All other  components within normal limits  PROTIME-INR  APTT  CBC  DIFFERENTIAL  TROPONIN I  GLUCOSE, CAPILLARY  CBG MONITORING, ED  POC URINE PREG, ED  POCT PREGNANCY, URINE   ____________________________________________  EKG  ED ECG REPORT I, Rudene Re, the attending physician, personally viewed and interpreted this ECG.  Sinus tachycardia, rate 117, normal intervals, normal axis, no ST elevations or depressions. No prior for comparison ____________________________________________  RADIOLOGY  Head CT: negative   MRI brain: Unremarkable appearance of the brain. No acute intracranial abnormality. ____________________________________________   PROCEDURES  Procedure(s) performed: None Procedures Critical Care performed:  None ____________________________________________   INITIAL IMPRESSION / ASSESSMENT AND PLAN / ED COURSE  21 y.o. female with no significant past medical history who presents for evaluation of right-sided facial droop and  numbness. Last seen normal at 8:15AM. NIHSS 1 on arrival. Evaluated by me and Dr. Doy Mince, Neurologist on call. No obvious droop on exam. Head CT negative, given ASA. Possibly early Bell's palsy. Dr. Doy Mince recommended MRI and if negative dc home on ASA and f/u with PCP    _________________________ 1:37 PM on 09/28/2017 -----------------------------------------  MRI, labs, pregnancy test all negative. Patient remains neuro intact with mild numbness on the R side of the face. Possibly developing Bells palsy. Discussed very strict return precautions with patient. Patient's mother asked fora prescription for valacyclovir and prednisone in case this is early bells palsy and there is a snow storm coming in which will make it difficult for them to see follow up during the storm. I explained to the patient that if symptoms are changing or evolving (I carefully explained difference between Bell's palsy and a stroke to the patient and her mother) that she needs to be evaluated again here and if there is transportation problems that she should call 911. Patient understands these recommendations and is comfortable with the plan. Will dc home on ASA and close f/u with PCP.   As part of my medical decision making, I reviewed the following data within the [place] notes reviewed and incorporated, Labs reviewed , EKG interpreted , Radiograph reviewed , A consult was requested and obtained from this/these consultant(s) Neurology, Notes from prior ED visits and [place] Controlled Substance Database    Pertinent labs & imaging results that were available during my care of the patient were reviewed by me and considered in my medical decision making (see chart for details).    ____________________________________________   FINAL CLINICAL IMPRESSION(S) / ED DIAGNOSES  Final diagnoses:  Right facial numbness      NEW MEDICATIONS STARTED DURING THIS VISIT:  ED Discharge Orders         Ordered    aspirin 81 MG tablet  Daily     09/28/17 1329    predniSONE (DELTASONE) 20 MG tablet  Daily     09/28/17 1329    valACYclovir (VALTREX) 1000 MG tablet  3 times daily     09/28/17 1329       Note:  This document was prepared using Dragon voice recognition software and may include unintentional dictation errors.    Rudene Re, MD 09/28/17 [phone]

## 2017-10-24 ENCOUNTER — Encounter: Payer: Self-pay | Admitting: Certified Nurse Midwife

## 2017-10-30 DIAGNOSIS — G43709 Chronic migraine without aura, not intractable, without status migrainosus: Secondary | ICD-10-CM | POA: Diagnosis not present

## 2017-10-30 DIAGNOSIS — R202 Paresthesia of skin: Secondary | ICD-10-CM | POA: Diagnosis not present

## 2017-10-30 DIAGNOSIS — Z0000 Encounter for general adult medical examination without abnormal findings: Secondary | ICD-10-CM | POA: Diagnosis not present

## 2018-01-18 DIAGNOSIS — J069 Acute upper respiratory infection, unspecified: Secondary | ICD-10-CM | POA: Diagnosis not present

## 2018-01-28 ENCOUNTER — Ambulatory Visit (INDEPENDENT_AMBULATORY_CARE_PROVIDER_SITE_OTHER): Payer: 59 | Admitting: Certified Nurse Midwife

## 2018-01-28 ENCOUNTER — Encounter: Payer: Self-pay | Admitting: Certified Nurse Midwife

## 2018-01-28 VITALS — BP 122/71 | HR 86 | Ht 66.0 in | Wt 149.5 lb

## 2018-01-28 DIAGNOSIS — Z01419 Encounter for gynecological examination (general) (routine) without abnormal findings: Secondary | ICD-10-CM | POA: Diagnosis not present

## 2018-01-28 DIAGNOSIS — Z8349 Family history of other endocrine, nutritional and metabolic diseases: Secondary | ICD-10-CM | POA: Diagnosis not present

## 2018-01-28 NOTE — Patient Instructions (Addendum)
Preventive Care 18-39 Years, Female Preventive care refers to lifestyle choices and visits with your health care provider that can promote health and wellness. What does preventive care include?  A yearly physical exam. This is also called an annual well check.  Dental exams once or twice a year.  Routine eye exams. Ask your health care provider how often you should have your eyes checked.  Personal lifestyle choices, including: ? Daily care of your teeth and gums. ? Regular physical activity. ? Eating a healthy diet. ? Avoiding tobacco and drug use. ? Limiting alcohol use. ? Practicing safe sex. ? Taking vitamin and mineral supplements as recommended by your health care provider. What happens during an annual well check? The services and screenings done by your health care provider during your annual well check will depend on your age, overall health, lifestyle risk factors, and family history of disease. Counseling Your health care provider may ask you questions about your:  Alcohol use.  Tobacco use.  Drug use.  Emotional well-being.  Home and relationship well-being.  Sexual activity.  Eating habits.  Work and work Statistician.  Method of birth control.  Menstrual cycle.  Pregnancy history.  Screening You may have the following tests or measurements:  Height, weight, and BMI.  Diabetes screening. This is done by checking your blood sugar (glucose) after you have not eaten for a while (fasting).  Blood pressure.  Lipid and cholesterol levels. These may be checked every 5 years starting at age 66.  Skin check.  Hepatitis C blood test.  Hepatitis B blood test.  Sexually transmitted disease (STD) testing.  BRCA-related cancer screening. This may be done if you have a family history of breast, ovarian, tubal, or peritoneal cancers.  Pelvic exam and Pap test. This may be done every 3 years starting at age 40. Starting at age 59, this may be done every 5  years if you have a Pap test in combination with an HPV test.  Discuss your test results, treatment options, and if necessary, the need for more tests with your health care provider. Vaccines Your health care provider may recommend certain vaccines, such as:  Influenza vaccine. This is recommended every year.  Tetanus, diphtheria, and acellular pertussis (Tdap, Td) vaccine. You may need a Td booster every 10 years.  Varicella vaccine. You may need this if you have not been vaccinated.  HPV vaccine. If you are 69 or younger, you may need three doses over 6 months.  Measles, mumps, and rubella (MMR) vaccine. You may need at least one dose of MMR. You may also need a second dose.  Pneumococcal 13-valent conjugate (PCV13) vaccine. You may need this if you have certain conditions and were not previously vaccinated.  Pneumococcal polysaccharide (PPSV23) vaccine. You may need one or two doses if you smoke cigarettes or if you have certain conditions.  Meningococcal vaccine. One dose is recommended if you are age 27-21 years and a first-year college student living in a residence hall, or if you have one of several medical conditions. You may also need additional booster doses.  Hepatitis A vaccine. You may need this if you have certain conditions or if you travel or work in places where you may be exposed to hepatitis A.  Hepatitis B vaccine. You may need this if you have certain conditions or if you travel or work in places where you may be exposed to hepatitis B.  Haemophilus influenzae type b (Hib) vaccine. You may need this if  you have certain risk factors.  Talk to your health care provider about which screenings and vaccines you need and how often you need them. This information is not intended to replace advice given to you by your health care provider. Make sure you discuss any questions you have with your health care provider. Document Released: 12/05/2001 Document Revised: 06/28/2016  Document Reviewed: 08/10/2015 Elsevier Interactive Patient Education  2018 [employer]  Preparing for Pregnancy If you are considering becoming pregnant, make an appointment to see your regular health care provider to learn how to prepare for a safe and healthy pregnancy (preconception care). During a preconception care visit, your health care provider will:  Do a complete physical exam, including a Pap test.  Take a complete medical history.  Give you information, answer your questions, and help you resolve problems.  Preconception checklist Medical history  Tell your health care provider about any current or past medical conditions. Your pregnancy or your ability to become pregnant may be affected by chronic conditions, such as diabetes, chronic hypertension, and thyroid problems.  Include your family's medical history as well as your partner's medical history.  Tell your health care provider about any history of STIs (sexually transmitted infections).These can affect your pregnancy. In some cases, they can be passed to your baby. Discuss any concerns that you have about STIs.  If indicated, discuss the benefits of genetic testing. This testing will show whether there are any genetic conditions that may be passed from you or your partner to your baby.  Tell your health care provider about: ? Any problems you have had with conception or pregnancy. ? Any medicines you take. These include vitamins, herbal supplements, and over-the-counter medicines. ? Your history of immunizations. Discuss any vaccinations that you may need.  Diet  Ask your health care provider what to include in a healthy diet that has a balance of nutrients. This is especially important when you are pregnant or preparing to become pregnant.  Ask your health care provider to help you reach a healthy weight before pregnancy. ? If you are overweight, you may be at higher risk for certain complications, such as high  blood pressure, diabetes, and preterm birth. ? If you are underweight, you are more likely to have a baby who has a low birth weight.  Lifestyle, work, and home  Let your health care provider know: ? About any lifestyle habits that you have, such as alcohol use, drug use, or smoking. ? About recreational activities that may put you at risk during pregnancy, such as downhill skiing and certain exercise programs. ? Tell your health care provider about any international travel, especially any travel to places with an active Zika virus outbreak. ? About harmful substances that you may be exposed to at work or at home. These include chemicals, pesticides, radiation, or even litter boxes. ? If you do not feel safe at home.  Mental health  Tell your health care provider about: ? Any history of mental health conditions, including feelings of depression, sadness, or anxiety. ? Any medicines that you take for a mental health condition. These include herbs and supplements.  Home instructions to prepare for pregnancy Lifestyle  Eat a balanced diet. This includes fresh fruits and vegetables, whole grains, lean meats, low-fat dairy products, healthy fats, and foods that are high in fiber. Ask to meet with a nutritionist or registered dietitian for assistance with meal planning and goals.  Get regular exercise. Try to be active for at   least 30 minutes a day on most days of the week. Ask your health care provider which activities are safe during pregnancy.  Do not use any products that contain nicotine or tobacco, such as cigarettes and e-cigarettes. If you need help quitting, ask your health care provider.  Do not drink alcohol.  Do not take illegal drugs.  Maintain a healthy weight. Ask your health care provider what weight range is right for you.  General instructions  Keep an accurate record of your menstrual periods. This makes it easier for your health care provider to determine your baby's  due date.  Begin taking prenatal vitamins and folic acid supplements daily as directed by your health care provider.  Manage any chronic conditions, such as high blood pressure and diabetes, as told by your health care provider. This is important.  How do I know that I am pregnant? You may be pregnant if you have been sexually active and you miss your period. Symptoms of early pregnancy include:  Mild cramping.  Very light vaginal bleeding (spotting).  Feeling unusually tired.  Nausea and vomiting (morning sickness).  If you have any of these symptoms and you suspect that you might be pregnant, you can take a home pregnancy test. These tests check for a hormone in your urine (human chorionic gonadotropin, or hCG). A woman's body begins to make this hormone during early pregnancy. These tests are very accurate. Wait until at least the first day after you miss your period to take one. If the test shows that you are pregnant (you get a positive result), call your health care provider to make an appointment for prenatal care. What should I do if I become pregnant?  Make an appointment with your health care provider as soon as you suspect you are pregnant.  Do not use any products that contain nicotine, such as cigarettes, chewing tobacco, and e-cigarettes. If you need help quitting, ask your health care provider.  Do not drink alcoholic beverages. Alcohol is related to a number of birth defects.  Avoid toxic odors and chemicals.  You may continue to have sexual intercourse if it does not cause pain or other problems, such as vaginal bleeding. This information is not intended to replace advice given to you by your health care provider. Make sure you discuss any questions you have with your health care provider. Document Released: 09/21/2008 Document Revised: 06/06/2016 Document Reviewed: 04/30/2016 Elsevier Interactive Patient Education  2018 [employer] Inc.  

## 2018-01-28 NOTE — Progress Notes (Signed)
ANNUAL PREVENTATIVE CARE GYN  ENCOUNTER NOTE  Subjective:       Stacey Harrison is a 22 y.o. G0P0000 female here for a routine annual gynecologic exam.  Current complaints: 1.  Desires pregnancy  Denies difficulty breathing or respiratory distress, chest pain, abdominal pain, excessive vaginal bleeding, dysuria, and leg pain or swelling.    Gynecologic History  Patient's last menstrual period was 01/23/2018 (exact date).  Contraception: none  Last Pap: 01/2016. Results were: normal  Obstetric History  OB History  Gravida Para Term Preterm AB Living  0 0 0 0 0 0  SAB TAB Ectopic Multiple Live Births  0 0 0 0 0    Past Medical History:  Diagnosis Date  . Migraine     Past Surgical History:  Procedure Laterality Date  . WISDOM TOOTH EXTRACTION  2014    Current Outpatient Medications on File Prior to Visit  Medication Sig Dispense Refill  . ondansetron (ZOFRAN-ODT) 8 MG disintegrating tablet Take 8 mg by mouth every 8 (eight) hours as needed. for nausea  0  . SUMAtriptan (IMITREX) 100 MG tablet Take 100 mg by mouth every 2 (two) hours as needed for migraine. May repeat in 2 hours if headache persists or recurs.     No current facility-administered medications on file prior to visit.     Allergies  Allergen Reactions  . Hydrocodone Itching    Social History   Socioeconomic History  . Marital status: Married    Spouse name: Not on file  . Number of children: Not on file  . Years of education: Not on file  . Highest education level: Not on file  Occupational History  . 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  . Financial resource strain: Not on file  . Food insecurity:    Worry: Not on file    Inability: Not on file  . Transportation needs:    Medical: Not on file    Non-medical: Not on file  Tobacco Use  . Smoking status: Never Smoker  . Smokeless tobacco: Never Used  Substance and Sexual Activity  . Alcohol use: Yes    Comment: rarely  . Drug use:  No  . Sexual activity: Yes    Birth control/protection: None  Lifestyle  . Physical activity:    Days per week: 3 days    Minutes per session: 30 min  . Stress: Not on file  Relationships  . Social connections:    Talks on phone: Not on file    Gets together: Not on file    Attends religious service: Not on file    Active member of club or organization: Not on file    Attends meetings of clubs or organizations: Not on file    Relationship status: Not on file  . Intimate partner violence:    Fear of current or ex partner: Not on file    Emotionally abused: Not on file    Physically abused: Not on file    Forced sexual activity: Not on file  Other Topics Concern  . Not on file  Social History Narrative  . Not on file    Family History  Problem Relation Age of Onset  . Breast cancer Maternal Grandmother   . Ovarian cancer Neg Hx   . Colon cancer Neg Hx   . Diabetes Neg Hx     The following portions of the patient's history were reviewed and updated as appropriate: allergies, current medications, past family history, past medical  history, past social history, past surgical history and problem list.  Review of Systems  ROS negative except as noted above. Information obtained from patient.    Objective:   BP 122/71   Pulse 86   Ht 5\' 6"  (1.676 m)   Wt 149 lb 8 oz (67.8 kg)   LMP 01/23/2018 (Exact Date)   BMI 24.13 kg/m    CONSTITUTIONAL: Well-developed, well-nourished female in  no acute distress.   PSYCHIATRIC: Normal mood and affect. Normal behavior. Normal judgment and thought content.  [place]: Alert and oriented to person, place, and time. Normal muscle tone coordination. No cranial nerve deficit noted.  HENT:  Normocephalic, atraumatic, External right and left ear normal. Oropharynx is clear and moist  EYES: Conjunctivae and EOM are normal. Pupils are equal and round.  NECK: Normal range of motion, supple, no masses.  Normal thyroid.   SKIN: Skin is  warm and dry. No rash noted. Not diaphoretic. No erythema. No pallor.  CARDIOVASCULAR: Normal heart rate noted, regular rhythm, no murmur.  RESPIRATORY: Clear to auscultation bilaterally. Effort and breath sounds normal, no problems with respiration noted.  BREASTS: Symmetric in size. No masses, skin changes, nipple drainage, or lymphadenopathy.  ABDOMEN: Soft, normal bowel sounds, no distention noted.  No tenderness, rebound or guarding.   PELVIC:  External Genitalia: Normal  Vagina: Normal  Cervix: Normal  Uterus: Normal  Adnexa: Normal   MUSCULOSKELETAL: Normal range of motion. No tenderness.  No cyanosis, clubbing, or edema.  2+ distal pulses.  LYMPHATIC: No Axillary, Supraclavicular, or Inguinal Adenopathy.  Assessment:   Annual gynecologic examination 22 y.o.   Contraception: none   Normal BMI   Problem List Items Addressed This Visit    None    Visit Diagnoses    Well woman exam with routine gynecological exam    -  Primary   Relevant Orders   CBC (Completed)   Thyroid Panel With TSH (Completed)   Comprehensive metabolic panel (Completed)   Lipid panel (Completed)   Family history of thyroid disease       Relevant Orders   Thyroid Panel With TSH (Completed)      Plan:   Pap: Not needed.   Labs: See orders.   Routine preventative health maintenance measures emphasized: Preconception counseling, Exercise/Diet/Weight control, Alcohol/Substance use risks and Stress Management; See AVS.   Discussed fertility tracking techniques.  Reviewed red flag symptoms and when to call.   RTC x 1 year for Annual Exam or sooner if needed.    Diona Fanti, CNM [facility], [facility]

## 2018-01-29 LAB — COMPREHENSIVE METABOLIC PANEL
ALT: 32 IU/L (ref 0–32)
AST: 31 IU/L (ref 0–40)
Albumin/Globulin Ratio: 2.2 (ref 1.2–2.2)
Albumin: 4.9 g/dL (ref 3.5–5.5)
Alkaline Phosphatase: 75 IU/L (ref 39–117)
BUN/Creatinine Ratio: 17 (ref 9–23)
BUN: 12 mg/dL (ref 6–20)
Bilirubin Total: 0.4 mg/dL (ref 0.0–1.2)
CALCIUM: 10 mg/dL (ref 8.7–10.2)
CHLORIDE: 99 mmol/L (ref 96–106)
CO2: 23 mmol/L (ref 20–29)
Creatinine, Ser: 0.69 mg/dL (ref 0.57–1.00)
GFR, EST AFRICAN AMERICAN: 142 mL/min/{1.73_m2} (ref >59)
GFR, EST NON AFRICAN AMERICAN: 123 mL/min/{1.73_m2} (ref >59)
GLUCOSE: 77 mg/dL (ref 65–99)
Globulin, Total: 2.2 g/dL (ref 1.5–4.5)
Potassium: 3.8 mmol/L (ref 3.5–5.2)
Sodium: 139 mmol/L (ref 134–144)
TOTAL PROTEIN: 7.1 g/dL (ref 6.0–8.5)

## 2018-01-29 LAB — CBC
HEMATOCRIT: 36.3 % (ref 34.0–46.6)
Hemoglobin: 12.5 g/dL (ref 11.1–15.9)
MCH: 28.7 pg (ref 26.6–33.0)
MCHC: 34.4 g/dL (ref 31.5–35.7)
MCV: 83 fL (ref 79–97)
Platelets: 293 10*3/uL (ref 150–379)
RBC: 4.35 x10E6/uL (ref 3.77–5.28)
RDW: 13.4 % (ref 12.3–15.4)
WBC: 8.7 10*3/uL (ref 3.4–10.8)

## 2018-01-29 LAB — LIPID PANEL
CHOL/HDL RATIO: 2 ratio (ref 0.0–4.4)
Cholesterol, Total: 160 mg/dL (ref 100–199)
HDL: 81 mg/dL (ref >39)
LDL Calculated: 70 mg/dL (ref 0–99)
TRIGLYCERIDES: 44 mg/dL (ref 0–149)
VLDL CHOLESTEROL CAL: 9 mg/dL (ref 5–40)

## 2018-01-29 LAB — THYROID PANEL WITH TSH
FREE THYROXINE INDEX: 1.7 (ref 1.2–4.9)
T3 Uptake Ratio: 21 % — ABNORMAL LOW (ref 24–39)
T4, Total: 8.2 ug/dL (ref 4.5–12.0)
TSH: 2.03 u[IU]/mL (ref 0.450–4.500)

## 2018-05-17 DIAGNOSIS — L920 Granuloma annulare: Secondary | ICD-10-CM | POA: Diagnosis not present

## 2018-05-17 DIAGNOSIS — D229 Melanocytic nevi, unspecified: Secondary | ICD-10-CM | POA: Diagnosis not present

## 2018-06-20 DIAGNOSIS — L920 Granuloma annulare: Secondary | ICD-10-CM | POA: Diagnosis not present

## 2018-07-01 DIAGNOSIS — G629 Polyneuropathy, unspecified: Secondary | ICD-10-CM | POA: Diagnosis not present

## 2018-07-07 ENCOUNTER — Encounter: Payer: Self-pay | Admitting: Certified Nurse Midwife

## 2018-07-15 ENCOUNTER — Encounter: Payer: 59 | Admitting: Certified Nurse Midwife

## 2018-07-15 ENCOUNTER — Other Ambulatory Visit: Payer: Self-pay | Admitting: Certified Nurse Midwife

## 2018-07-15 ENCOUNTER — Ambulatory Visit: Payer: 59 | Admitting: Certified Nurse Midwife

## 2018-07-15 ENCOUNTER — Other Ambulatory Visit (INDEPENDENT_AMBULATORY_CARE_PROVIDER_SITE_OTHER): Payer: 59

## 2018-07-15 ENCOUNTER — Encounter: Payer: Self-pay | Admitting: Certified Nurse Midwife

## 2018-07-15 VITALS — BP 126/84 | HR 118 | Ht 66.0 in | Wt 150.5 lb

## 2018-07-15 DIAGNOSIS — N926 Irregular menstruation, unspecified: Secondary | ICD-10-CM

## 2018-07-15 DIAGNOSIS — N8311 Corpus luteum cyst of right ovary: Secondary | ICD-10-CM

## 2018-07-15 DIAGNOSIS — Z3A01 Less than 8 weeks gestation of pregnancy: Secondary | ICD-10-CM | POA: Diagnosis not present

## 2018-07-15 DIAGNOSIS — Z3201 Encounter for pregnancy test, result positive: Secondary | ICD-10-CM

## 2018-07-15 DIAGNOSIS — O3411 Maternal care for benign tumor of corpus uteri, first trimester: Secondary | ICD-10-CM

## 2018-07-15 LAB — POCT URINE PREGNANCY: PREG TEST UR: POSITIVE — AB

## 2018-07-15 NOTE — Patient Instructions (Addendum)
Eating Plan for Pregnant Women While you are pregnant, your body will require additional nutrition to help support your growing baby. It is recommended that you consume:  150 additional calories each day during your first trimester.  300 additional calories each day during your second trimester.  300 additional calories each day during your third trimester.  Eating a healthy, well-balanced diet is very important for your health and for your baby's health. You also have a higher need for some vitamins and minerals, such as folic acid, calcium, iron, and vitamin D. What do I need to know about eating during pregnancy?  Do not try to lose weight or go on a diet during pregnancy.  Choose healthy, nutritious foods. Choose  of a sandwich with a glass of milk instead of a candy bar or a high-calorie sugar-sweetened beverage.  Limit your overall intake of foods that have "empty calories." These are foods that have little nutritional value, such as sweets, desserts, candies, sugar-sweetened beverages, and fried foods.  Eat a variety of foods, especially fruits and vegetables.  Take a prenatal vitamin to help meet the additional needs during pregnancy, specifically for folic acid, iron, calcium, and vitamin D.  Remember to stay active. Ask your health care provider for exercise recommendations that are specific to you.  Practice good food safety and cleanliness, such as washing your hands before you eat and after you prepare raw meat. This helps to prevent foodborne illnesses, such as listeriosis, that can be very dangerous for your baby. Ask your health care provider for more information about listeriosis. What does 150 extra calories look like? Healthy options for an additional 150 calories each day could be any of the following:  Plain low-fat yogurt (6-8 oz) with  cup of berries.  1 apple with 2 teaspoons of peanut butter.  Cut-up vegetables with  cup of hummus.  Low-fat chocolate milk  (8 oz or 1 cup).  1 string cheese with 1 medium orange.   of a peanut butter and jelly sandwich on whole-wheat bread (1 tsp of peanut butter).  For 300 calories, you could eat two of those healthy options each day. What is a healthy amount of weight to gain? The recommended amount of weight for you to gain is based on your pre-pregnancy BMI. If your pre-pregnancy BMI was:  Less than 18 (underweight), you should gain 28-40 lb.  18-24.9 (normal), you should gain 25-35 lb.  25-29.9 (overweight), you should gain 15-25 lb.  Greater than 30 (obese), you should gain 11-20 lb.  What if I am having twins or multiples? Generally, pregnant women who will be having twins or multiples may need to increase their daily calories by 300-600 calories each day. The recommended range for total weight gain is 25-54 lb, depending on your pre-pregnancy BMI. Talk with your health care provider for specific guidance about additional nutritional needs, weight gain, and exercise during your pregnancy. What foods can I eat? Grains Any grains. Try to choose whole grains, such as whole-wheat bread, oatmeal, or brown rice. Vegetables Any vegetables. Try to eat a variety of colors and types of vegetables to get a full range of vitamins and minerals. Remember to wash your vegetables well before eating. Fruits Any fruits. Try to eat a variety of colors and types of fruit to get a full range of vitamins and minerals. Remember to wash your fruits well before eating. Meats and Other Protein Sources Lean meats, including chicken, [place], fish, and lean cuts of beef, veal,  or pork. Make sure that all meats are cooked to "well done." Tofu. Tempeh. Beans. Eggs. Peanut butter and other nut butters. Seafood, such as shrimp, crab, and lobster. If you choose fish, select types that are higher in omega-3 fatty acids, including salmon, herring, mussels, trout, sardines, and pollock. Make sure that all meats are cooked to food-safe  temperatures. Dairy Pasteurized milk and milk alternatives. Pasteurized yogurt and pasteurized cheese. Cottage cheese. Sour cream. Beverages Water. Juices that contain 100% fruit juice or vegetable juice. Caffeine-free teas and decaffeinated coffee. Drinks that contain caffeine are okay to drink, but it is better to avoid caffeine. Keep your total caffeine intake to less than 200 mg each day (12 oz of coffee, tea, or soda) or as directed by your health care provider. Condiments Any pasteurized condiments. Sweets and Desserts Any sweets and desserts. Fats and Oils Any fats and oils. The items listed above may not be a complete list of recommended foods or beverages. Contact your dietitian for more options. What foods are not recommended? Vegetables Unpasteurized (raw) vegetable juices. Fruits Unpasteurized (raw) fruit juices. Meats and Other Protein Sources Cured meats that have nitrates, such as bacon, salami, and hotdogs. Luncheon meats, bologna, or other deli meats (unless they are reheated until they are steaming hot). Refrigerated pate, meat spreads from a meat counter, smoked seafood that is found in the refrigerated section of a store. Raw fish, such as sushi or sashimi. High mercury content fish, such as tilefish, shark, swordfish, and king mackerel. Raw meats, such as tuna or beef tartare. Undercooked meats and poultry. Make sure that all meats are cooked to food-safe temperatures. Dairy Unpasteurized (raw) milk and any foods that have raw milk in them. Soft cheeses, such as feta, queso blanco, queso fresco, Brie, Camembert cheeses, blue-veined cheeses, and Panela cheese (unless it is made with pasteurized milk, which must be stated on the label). Beverages Alcohol. Sugar-sweetened beverages, such as sodas, teas, or energy drinks. Condiments Homemade fermented foods and drinks, such as pickles, sauerkraut, or kombucha drinks. (Store-bought pasteurized versions of these are  okay.) Other Salads that are made in the store, such as ham salad, chicken salad, egg salad, tuna salad, and seafood salad. The items listed above may not be a complete list of foods and beverages to avoid. Contact your dietitian for more information. This information is not intended to replace advice given to you by your health care provider. Make sure you discuss any questions you have with your health care provider. Document Released: 07/24/2014 Document Revised: 03/16/2016 Document Reviewed: 03/24/2014 Elsevier Interactive Patient Education  2018 [employer]. Common Medications Safe in Pregnancy  Acne:      Constipation:  Benzoyl Peroxide     Colace  Clindamycin      Dulcolax Suppository  Topica Erythromycin     Fibercon  Salicylic Acid      Metamucil         Miralax AVOID:        Senakot   Accutane    Cough:  Retin-A       Cough Drops  Tetracycline      Phenergan w/ Codeine if Rx  Minocycline      Robitussin (Plain & DM)  Antibiotics:     Crabs/Lice:  Ceclor       RID  Cephalosporins    AVOID:  E-Mycins      Kwell  Keflex  Macrobid/Macrodantin   Diarrhea:  Penicillin      Kao-Pectate  Zithromax  Imodium AD         PUSH FLUIDS AVOID:       Cipro     Fever:  Tetracycline      Tylenol (Regular or Extra  Minocycline       Strength)  Levaquin      Extra Strength-Do not          Exceed 8 tabs/24 hrs Caffeine:        <262m/day (equiv. To 1 cup of coffee or  approx. 3 12 oz sodas)         Gas: Cold/Hayfever:       Gas-X  Benadryl      Mylicon  Claritin       Phazyme  **Claritin-D        Chlor-Trimeton    Headaches:  Dimetapp      ASA-Free Excedrin  Drixoral-Non-Drowsy     Cold Compress  Mucinex (Guaifenasin)     Tylenol (Regular or Extra  Sudafed/Sudafed-12 Hour     Strength)  **Sudafed PE Pseudoephedrine   Tylenol Cold & Sinus     Vicks Vapor Rub  Zyrtec  **AVOID if Problems With Blood Pressure         Heartburn: Avoid lying down for at least 1 hour  after meals  Aciphex      Maalox     Rash:  Milk of Magnesia     Benadryl    Mylanta       1% Hydrocortisone Cream  Pepcid  Pepcid Complete   Sleep Aids:  Prevacid      Ambien   Prilosec       Benadryl  Rolaids       Chamomile Tea  Tums (Limit 4/day)     Unisom  Zantac       Tylenol PM         Warm milk-add vanilla or  Hemorrhoids:       Sugar for taste  Anusol/Anusol H.C.  (RX: Analapram 2.5%)  Sugar Substitutes:  Hydrocortisone OTC     Ok in moderation  Preparation H      Tucks        Vaseline lotion applied to tissue with wiping    Herpes:     Throat:  Acyclovir      Oragel  Famvir  Valtrex     Vaccines:         Flu Shot Leg Cramps:       *Gardasil  Benadryl      Hepatitis A         Hepatitis B Nasal Spray:       Pneumovax  Saline Nasal Spray     Polio Booster         Tetanus Nausea:       Tuberculosis test or PPD  Vitamin B6 25 mg TID   AVOID:    Dramamine      *Gardasil  Emetrol       Live Poliovirus  Ginger Root 250 mg QID    MMR (measles, mumps &  High Complex Carbs @ Bedtime    rebella)  Sea Bands-Accupressure    Varicella (Chickenpox)  Unisom 1/2 tab TID     *No known complications           If received before Pain:         Known pregnancy;   Darvocet       Resume series after  Lortab        Delivery  Percocet  Yeast:   Tramadol      Femstat  Tylenol 3      Gyne-lotrimin  Ultram       Monistat  Vicodin           MISC:         All Sunscreens           Hair Coloring/highlights          Insect Repellant's          (Including DEET)         Mystic Tans First Trimester of Pregnancy The first trimester of pregnancy is from week 1 until the end of week 13 (months 1 through 3). During this time, your baby will begin to develop inside you. At 6-8 weeks, the eyes and face are formed, and the heartbeat can be seen on ultrasound. At the end of 12 weeks, all the baby's organs are formed. Prenatal care is all the medical care you receive before the birth of your  baby. Make sure you get good prenatal care and follow all of your doctor's instructions. Follow these instructions at home: Medicines  Take over-the-counter and prescription medicines only as told by your doctor. Some medicines are safe and some medicines are not safe during pregnancy.  Take a prenatal vitamin that contains at least 600 micrograms (mcg) of folic acid.  If you have trouble pooping (constipation), take medicine that will make your stool soft (stool softener) if your doctor approves. Eating and drinking  Eat regular, healthy meals.  Your doctor will tell you the amount of weight gain that is right for you.  Avoid raw meat and uncooked cheese.  If you feel sick to your stomach (nauseous) or throw up (vomit): ? Eat 4 or 5 small meals a day instead of 3 large meals. ? Try eating a few soda crackers. ? Drink liquids between meals instead of during meals.  To prevent constipation: ? Eat foods that are high in fiber, like fresh fruits and vegetables, whole grains, and beans. ? Drink enough fluids to keep your pee (urine) clear or pale yellow. Activity  Exercise only as told by your doctor. Stop exercising if you have cramps or pain in your lower belly (abdomen) or low back.  Do not exercise if it is too hot, too humid, or if you are in a place of great height (high altitude).  Try to avoid standing for long periods of time. Move your legs often if you must stand in one place for a long time.  Avoid heavy lifting.  Wear low-heeled shoes. Sit and stand up straight.  You can have sex unless your doctor tells you not to. Relieving pain and discomfort  Wear a good support bra if your breasts are sore.  Take warm water baths (sitz baths) to soothe pain or discomfort caused by hemorrhoids. Use hemorrhoid cream if your doctor says it is okay.  Rest with your legs raised if you have leg cramps or low back pain.  If you have puffy, bulging veins (varicose veins) in your  legs: ? Wear support hose or compression stockings as told by your doctor. ? Raise (elevate) your feet for 15 minutes, 3-4 times a day. ? Limit salt in your food. Prenatal care  Schedule your prenatal visits by the twelfth week of pregnancy.  Write down your questions. Take them to your prenatal visits.  Keep all your prenatal visits as told by your doctor. This is important. Safety  Wear your  seat belt at all times when driving.  Make a list of emergency phone numbers. The list should include numbers for family, friends, the hospital, and police and fire departments. General instructions  Ask your doctor for a referral to a local prenatal class. Begin classes no later than at the start of month 6 of your pregnancy.  Ask for help if you need counseling or if you need help with nutrition. Your doctor can give you advice or tell you where to go for help.  Do not use hot tubs, steam rooms, or saunas.  Do not douche or use tampons or scented sanitary pads.  Do not cross your legs for long periods of time.  Avoid all herbs and alcohol. Avoid drugs that are not approved by your doctor.  Do not use any tobacco products, including cigarettes, chewing tobacco, and electronic cigarettes. If you need help quitting, ask your doctor. You may get counseling or other support to help you quit.  Avoid cat litter boxes and soil used by cats. These carry germs that can cause birth defects in the baby and can cause a loss of your baby (miscarriage) or stillbirth.  Visit your dentist. At home, brush your teeth with a soft toothbrush. Be gentle when you floss. Contact a doctor if:  You are dizzy.  You have mild cramps or pressure in your lower belly.  You have a nagging pain in your belly area.  You continue to feel sick to your stomach, you throw up, or you have watery poop (diarrhea).  You have a bad smelling fluid coming from your vagina.  You have pain when you pee (urinate).  You have  increased puffiness (swelling) in your face, hands, legs, or ankles. Get help right away if:  You have a fever.  You are leaking fluid from your vagina.  You have spotting or bleeding from your vagina.  You have very bad belly cramping or pain.  You gain or lose weight rapidly.  You throw up blood. It may look like coffee grounds.  You are around people who have [place] measles, fifth disease, or chickenpox.  You have a very bad headache.  You have shortness of breath.  You have any kind of trauma, such as from a fall or a car accident. Summary  The first trimester of pregnancy is from week 1 until the end of week 13 (months 1 through 3).  To take care of yourself and your unborn baby, you will need to eat healthy meals, take medicines only if your doctor tells you to do so, and do activities that are safe for you and your baby.  Keep all follow-up visits as told by your doctor. This is important as your doctor will have to ensure that your baby is healthy and growing well. This information is not intended to replace advice given to you by your health care provider. Make sure you discuss any questions you have with your health care provider. Document Released: 03/27/2008 Document Revised: 10/17/2016 Document Reviewed: 10/17/2016 Enis Slipper Back Pain in Pregnancy Back pain during pregnancy is common. Back pain may be caused by several factors that are related to changes during your pregnancy. Follow these instructions at home: Managing pain, stiffness, and swelling  If directed, apply ice for sudden (acute) back pain. ? Put ice in a plastic bag. ? Place a towel between your skin and the bag. ? Leave the ice on for 20 minutes, 2-3 times per day.  If directed, apply heat  to the affected area before you exercise: ? Place a towel between your skin and the heat pack or heating pad. ? Leave the heat on for 20-30 minutes. ? Remove the heat if your skin turns bright red. This is  especially important if you are unable to feel pain, heat, or cold. You may have a greater risk of getting burned. Activity  Exercise as told by your health care provider. Exercising is the best way to prevent or manage back pain.  Listen to your body when lifting. If lifting hurts, ask for help or bend your knees. This uses your leg muscles instead of your back muscles.  Squat down when picking up something from the floor. Do not bend over.  Only use bed rest as told by your health care provider. Bed rest should only be used for the most severe episodes of back pain. Standing, Sitting, and Lying Down  Do not stand in one place for long periods of time.  Use good posture when sitting. Make sure your head rests over your shoulders and is not hanging forward. Use a pillow on your lower back if necessary.  Try sleeping on your side, preferably the left side, with a pillow or two between your legs. If you are sore after a night's rest, your bed may be too soft. A firm mattress may provide more support for your back during pregnancy. General instructions  Do not wear high heels.  Eat a healthy diet. Try to gain weight within your health care provider's recommendations.  Use a maternity girdle, elastic sling, or back brace as told by your health care provider.  Take over-the-counter and prescription medicines only as told by your health care provider.  Keep all follow-up visits as told by your health care provider. This is important. This includes any visits with any specialists, such as a physical therapist. Contact a health care provider if:  Your back pain interferes with your daily activities.  You have increasing pain in other parts of your body. Get help right away if:  You develop numbness, tingling, weakness, or problems with the use of your arms or legs.  You develop severe back pain that is not controlled with medicine.  You have a sudden change in bowel or bladder  control.  You develop shortness of breath, dizziness, or you faint.  You develop nausea, vomiting, or sweating.  You have back pain that is a rhythmic, cramping pain similar to labor pains. Labor pain is usually 1-2 minutes apart, lasts for about 1 minute, and involves a bearing down feeling or pressure in your pelvis.  You have back pain and your water breaks or you have vaginal bleeding.  You have back pain or numbness that travels down your leg.  Your back pain developed after you fell.  You develop pain on one side of your back.  You see blood in your urine.  You develop skin blisters in the area of your back pain. This information is not intended to replace advice given to you by your health care provider. Make sure you discuss any questions you have with your health care provider. Document Released: 01/17/2006 Document Revised: 03/16/2016 Document Reviewed: 06/23/2015 Elsevier Interactive Patient Education  2018 [employer]. r Interactive Patient Education  2017 [employer]. Round Ligament Pain The round ligament is a cord of muscle and tissue that helps to support the uterus. It can become a source of pain during pregnancy if it becomes stretched or twisted as the  baby grows. The pain usually begins in the second trimester of pregnancy, and it can come and go until the baby is delivered. It is not a serious problem, and it does not cause harm to the baby. Round ligament pain is usually a short, sharp, and pinching pain, but it can also be a dull, lingering, and aching pain. The pain is felt in the lower side of the abdomen or in the groin. It usually starts deep in the groin and moves up to the outside of the hip area. Pain can occur with:  A sudden change in position.  Rolling over in bed.  Coughing or sneezing.  Physical activity.  Follow these instructions at home: Watch your condition for any changes. Take these steps to help with your pain:  When the pain starts,  relax. Then try: ? Sitting down. ? Flexing your knees up to your abdomen. ? Lying on your side with one pillow under your abdomen and another pillow between your legs. ? Sitting in a warm bath for 15-20 minutes or until the pain goes away.  Take over-the-counter and prescription medicines only as told by your health care provider.  Move slowly when you sit and stand.  Avoid long walks if they cause pain.  Stop or lessen your physical activities if they cause pain.  Contact a health care provider if:  Your pain does not go away with treatment.  You feel pain in your back that you did not have before.  Your medicine is not helping. Get help right away if:  You develop a fever or chills.  You develop uterine contractions.  You develop vaginal bleeding.  You develop nausea or vomiting.  You develop diarrhea.  You have pain when you urinate. This information is not intended to replace advice given to you by your health care provider. Make sure you discuss any questions you have with your health care provider. Document Released: 07/18/2008 Document Revised: 03/16/2016 Document Reviewed: 12/16/2014 Elsevier Interactive Patient Education  [employer].

## 2018-07-16 ENCOUNTER — Encounter: Payer: Self-pay | Admitting: Certified Nurse Midwife

## 2018-07-18 ENCOUNTER — Encounter: Payer: Self-pay | Admitting: Certified Nurse Midwife

## 2018-07-18 MED ORDER — MAGNESIUM OXIDE 400 (241.3 MG) MG PO TABS: 400.0000 mg | ORAL_TABLET | Freq: Two times a day (BID) | ORAL | 1 refills | Status: DC

## 2018-07-18 NOTE — Progress Notes (Signed)
GYN ENCOUNTER NOTE  Subjective:       Stacey Harrison is a 22 y.o. G59P0000 female here for pregnancy confirmation.   Reports nausea with intermittent vomiting and lower abdominal cramping.   Denies difficulty breathing or respiratory distress, chest pain, abdominal pain, vaginal bleeding, dysuria, and leg pain or swelling.   Accompanied by spouse.      Gynecologic History  Patient's last menstrual period was 06/02/2018.  Contraception: none  Last Pap: 01/2016. Results were: normal  Obstetric History  OB History  Gravida Para Term Preterm AB Living  1 0 0 0 0 0  SAB TAB Ectopic Multiple Live Births  0 0 0 0 0    # Outcome Date GA Lbr Len/2nd Weight Sex Delivery Anes PTL Lv  1 Current             Past Medical History:  Diagnosis Date  . Migraine     Past Surgical History:  Procedure Laterality Date  . WISDOM TOOTH EXTRACTION  2014    Current Outpatient Medications on File Prior to Visit  Medication Sig Dispense Refill  . ondansetron (ZOFRAN-ODT) 8 MG disintegrating tablet Take 8 mg by mouth every 8 (eight) hours as needed. for nausea  0  . SUMAtriptan (IMITREX) 100 MG tablet Take 100 mg by mouth every 2 (two) hours as needed for migraine. May repeat in 2 hours if headache persists or recurs.     No current facility-administered medications on file prior to visit.     Allergies  Allergen Reactions  . Hydrocodone Itching    Social History   Socioeconomic History  . Marital status: Married    Spouse name: Not on file  . Number of children: Not on file  . Years of education: Not on file  . Highest education level: Not on file  Occupational History  . Not on file  Social Needs  . Financial resource strain: Not on file  . Food insecurity:    Worry: Not on file    Inability: Not on file  . Transportation needs:    Medical: Not on file    Non-medical: Not on file  Tobacco Use  . Smoking status: Never Smoker  . Smokeless tobacco: Never Used   Substance and Sexual Activity  . Alcohol use: Yes    Comment: rarely  . Drug use: No  . Sexual activity: Yes    Birth control/protection: None  Lifestyle  . Physical activity:    Days per week: 3 days    Minutes per session: 30 min  . Stress: Not on file  Relationships  . Social connections:    Talks on phone: Not on file    Gets together: Not on file    Attends religious service: Not on file    Active member of club or organization: Not on file    Attends meetings of clubs or organizations: Not on file    Relationship status: Not on file  . Intimate partner violence:    Fear of current or ex partner: Not on file    Emotionally abused: Not on file    Physically abused: Not on file    Forced sexual activity: Not on file  Other Topics Concern  . Not on file  Social History Narrative  . Not on file    Family History  Problem Relation Age of Onset  . Breast cancer Maternal Grandmother   . Ovarian cancer Neg Hx   . Colon cancer Neg Hx   .  Diabetes Neg Hx     The following portions of the patient's history were reviewed and updated as appropriate: allergies, current medications, past family history, past medical history, past social history, past surgical history and problem list.  Review of Systems  ROS negative except as noted above. Information obtained from patient.   Objective:   BP 126/84   Pulse (!) 118   Ht 5\' 6"  (1.676 m)   Wt 150 lb 8 oz (68.3 kg)   LMP 06/02/2018   BMI 24.29 kg/m   GENERAL: Alert and oriented x 4, no apparent distress.   POSITIVE URINE PREGNANCY TEST  Physical Exam: Not indicated.   ULTRASOUND REPORT  Location: [facility] Date of Service:  07/15/2018  Indications: Dating/Viability Findings:  [name] intrauterine pregnancy is visualized with a CRL consistent with [redacted] weeks gestation, giving an (U/S) EDD of 03/10/19. The (U/S) EDD is consistent with the clinically established (LMP) EDD of 03/09/19.  FHR: 105  BPM CRL measurement: 3.7 mm Yolk sac and early anatomy is normal.  Right Ovary measures 4.1 x 3.6 x 2.8 cm. It is normal in appearance. Left Ovary measures 3.0 x 1.9 x 1.9 cm. It is normal appearance. There is evidence of a corpus luteal cyst in the right ovary. Survey of the adnexa demonstrates no adnexal masses. There is no free peritoneal fluid in the cul de sac.  Impression: 1. 6 week Viable Singleton Intrauterine pregnancy by U/S. 2. (U/S) EDD is consistent with Clinically established (LMP) EDD of 03/09/19.  Recommendations: 1.Clinical correlation with the patient's History and Physical Exam.   Assessment:   1. Missed menses - POCT urine pregnancy  Plan:   Ultrasound findings reviewed with patient and spouse.   First trimester education; see AVS.   Reviewed red flag symptoms and when to call.   RTC x 2 weeks for Nurse Intake or sooner if needed.    Diona Fanti, CNM [facility], [facility]

## 2018-07-28 ENCOUNTER — Encounter: Payer: Self-pay | Admitting: Certified Nurse Midwife

## 2018-07-29 ENCOUNTER — Other Ambulatory Visit: Payer: Self-pay

## 2018-07-29 MED ORDER — DOXYLAMINE-PYRIDOXINE ER 20-20 MG PO TBCR: 20.0000 mg | EXTENDED_RELEASE_TABLET | Freq: Two times a day (BID) | ORAL | 3 refills | Status: DC

## 2018-07-29 NOTE — Patient Instructions (Signed)
WHAT OB PATIENTS CAN EXPECT   Confirmation of pregnancy and ultrasound ordered if medically indicated-[redacted] weeks gestation  New OB (NOB) intake with nurse and New OB (NOB) labs- [redacted] weeks gestation  New OB (NOB) physical examination with provider- 11/[redacted] weeks gestation  Flu vaccine-[redacted] weeks gestation  Anatomy scan-[redacted] weeks gestation  Glucose tolerance test, blood work to test for anemia, T-dap vaccine-[redacted] weeks gestation  Vaginal swabs/cultures-STD/Group B strep-[redacted] weeks gestation  Appointments every 4 weeks until 28 weeks  Every 2 weeks from 28 weeks until 36 weeks  Weekly visits from 36 weeks until delivery  Tdap Vaccine (Tetanus, Diphtheria and Pertussis): What You Need to Know 1. Why get vaccinated? Tetanus, diphtheria and pertussis are very serious diseases. Tdap vaccine can protect [place] from these diseases. And, Tdap vaccine given to pregnant women can protect newborn babies against pertussis. TETANUS (Lockjaw) is rare in the [place] today. It causes painful muscle tightening and stiffness, usually all over the body.  It can lead to tightening of muscles in the head and neck so you can't open your mouth, swallow, or sometimes even breathe. Tetanus kills about 1 out of 10 people who are infected even after receiving the best medical care.  DIPHTHERIA is also rare in the [place] today. It can cause a thick coating to form in the back of the throat.  It can lead to breathing problems, heart failure, paralysis, and death.  PERTUSSIS (Whooping Cough) causes severe coughing spells, which can cause difficulty breathing, vomiting and disturbed sleep.  It can also lead to weight loss, incontinence, and rib fractures. Up to 2 in 100 adolescents and 5 in 100 adults with pertussis are hospitalized or have complications, which could include pneumonia or death.  These diseases are caused by bacteria. Diphtheria and pertussis are spread from person to person through secretions from  coughing or sneezing. Tetanus enters the body through cuts, scratches, or wounds. Before vaccines, as many as 200,000 cases of diphtheria, 200,000 cases of pertussis, and hundreds of cases of tetanus, were reported in the [place] each year. Since vaccination began, reports of cases for tetanus and diphtheria have dropped by about 99% and for pertussis by about 80%. 2. Tdap vaccine Tdap vaccine can protect adolescents and adults from tetanus, diphtheria, and pertussis. One dose of Tdap is routinely given at age 39 or 43. People who did not get Tdap at that age should get it as soon as possible. Tdap is especially important for healthcare professionals and anyone having close contact with a baby younger than 12 months. Pregnant women should get a dose of Tdap during every pregnancy, to protect the newborn from pertussis. Infants are most at risk for severe, life-threatening complications from pertussis. Another vaccine, called Td, protects against tetanus and diphtheria, but not pertussis. A Td booster should be given every 10 years. Tdap may be given as one of these boosters if you have never gotten Tdap before. Tdap may also be given after a severe cut or burn to prevent tetanus infection. Your doctor or the person giving you the vaccine can give you more information. Tdap may safely be given at the same time as other vaccines. 3. Some people should not get this vaccine  A person who has ever had a life-threatening allergic reaction after a previous dose of any diphtheria, tetanus or pertussis containing vaccine, OR has a severe allergy to any part of this vaccine, should not get Tdap vaccine. Tell the person giving the vaccine about  any severe allergies.  Anyone who had coma or long repeated seizures within 7 days after a childhood dose of DTP or DTaP, or a previous dose of Tdap, should not get Tdap, unless a cause other than the vaccine was found. They can still get Td.  Talk to your doctor if  you: ? have seizures or another nervous system problem, ? had severe pain or swelling after any vaccine containing diphtheria, tetanus or pertussis, ? ever had a condition called Guillain-Barr Syndrome (GBS), ? aren't feeling well on the day the shot is scheduled. 4. Risks With any medicine, including vaccines, there is a chance of side effects. These are usually mild and go away on their own. Serious reactions are also possible but are rare. Most people who get Tdap vaccine do not have any problems with it. Mild problems following Tdap: (Did not interfere with activities)  Pain where the shot was given (about 3 in 4 adolescents or 2 in 3 adults)  Redness or swelling where the shot was given (about 1 person in 5)  Mild fever of at least 100.73F (up to about 1 in 25 adolescents or 1 in 100 adults)  Headache (about 3 or 4 people in 10)  Tiredness (about 1 person in 3 or 4)  Nausea, vomiting, diarrhea, stomach ache (up to 1 in 4 adolescents or 1 in 10 adults)  Chills, sore joints (about 1 person in 10)  Body aches (about 1 person in 3 or 4)  Rash, swollen glands (uncommon)  Moderate problems following Tdap: (Interfered with activities, but did not require medical attention)  Pain where the shot was given (up to 1 in 5 or 6)  Redness or swelling where the shot was given (up to about 1 in 16 adolescents or 1 in 12 adults)  Fever over 102F (about 1 in 100 adolescents or 1 in 250 adults)  Headache (about 1 in 7 adolescents or 1 in 10 adults)  Nausea, vomiting, diarrhea, stomach ache (up to 1 or 3 people in 100)  Swelling of the entire arm where the shot was given (up to about 1 in 500).  Severe problems following Tdap: (Unable to perform usual activities; required medical attention)  Swelling, severe pain, bleeding and redness in the arm where the shot was given (rare).  Problems that could happen after any vaccine:  People sometimes faint after a medical procedure,  including vaccination. Sitting or lying down for about 15 minutes can help prevent fainting, and injuries caused by a fall. Tell your doctor if you feel dizzy, or have vision changes or ringing in the ears.  Some people get severe pain in the shoulder and have difficulty moving the arm where a shot was given. This happens very rarely.  Any medication can cause a severe allergic reaction. Such reactions from a vaccine are very rare, estimated at fewer than 1 in a million doses, and would happen within a few minutes to a few hours after the vaccination. As with any medicine, there is a very remote chance of a vaccine causing a serious injury or death. The safety of vaccines is always being monitored. For more information, visit: http://www.aguilar.org/ 5. What if there is a serious problem? What should I look for? Look for anything that concerns you, such as signs of a severe allergic reaction, very high fever, or unusual behavior. Signs of a severe allergic reaction can include hives, swelling of the face and throat, difficulty breathing, a fast heartbeat, dizziness, and weakness.  These would usually start a few minutes to a few hours after the vaccination. What should I do?  If you think it is a severe allergic reaction or other emergency that can't wait, call 9-1-1 or get the person to the nearest hospital. Otherwise, call your doctor.  Afterward, the reaction should be reported to the Vaccine Adverse Event Reporting System (VAERS). Your doctor might file this report, or you can do it yourself through the VAERS web site at www.vaers.SamedayNews.es, or by calling 769-529-5915. ? VAERS does not give medical advice. 6. The National Vaccine Injury Compensation Program The [employer] Vaccine Injury Compensation Program (VICP) is a federal program that was created to compensate people who may have been injured by certain vaccines. Persons who believe they may have been injured by a vaccine can learn about  the program and about filing a claim by calling 712-165-9299 or visiting the [place] website at GoldCloset.com.ee. There is a time limit to file a claim for compensation. 7. How can I learn more?  Ask your doctor. He or she can give you the vaccine package insert or suggest other sources of information.  Call your local or state health department.  Contact the Centers for Disease Control and Prevention (CDC): ? Call (928)653-8315 (1-800-CDC-INFO) or ? Visit CDC's website at http://hunter.com/ CDC Tdap Vaccine VIS (12/16/13) This information is not intended to replace advice given to you by your health care provider. Make sure you discuss any questions you have with your health care provider. Document Released: 04/09/2012 Document Revised: 06/29/2016 Document Reviewed: 06/29/2016 Elsevier Interactive Patient Education  2017 [place]. Influenza (Flu) Vaccine (Inactivated or Recombinant): What You Need to Know 1. Why get vaccinated? Influenza ("flu") is a contagious disease that spreads around the [place] every year, usually between October and May. Flu is caused by influenza viruses, and is spread mainly by coughing, sneezing, and close contact. Anyone can get flu. Flu strikes suddenly and can last several days. Symptoms vary by age, but can include:  fever/chills  sore throat  muscle aches  fatigue  cough  headache  runny or stuffy nose  Flu can also lead to pneumonia and blood infections, and cause diarrhea and seizures in children. If you have a medical condition, such as heart or lung disease, flu can make it worse. Flu is more dangerous for some people. Infants and young children, people 51 years of age and older, pregnant women, and people with certain health conditions or a weakened immune system are at greatest risk. Each year thousands of people in the [place] die from flu, and many more are hospitalized. Flu vaccine can:  keep you from  getting flu,  make flu less severe if you do get it, and  keep you from spreading flu to your family and other people. 2. Inactivated and recombinant flu vaccines A dose of flu vaccine is recommended every flu season. Children 6 months through 74 years of age may need two doses during the same flu season. Everyone else needs only one dose each flu season. Some inactivated flu vaccines contain a very small amount of a mercury-based preservative called thimerosal. Studies have not shown thimerosal in vaccines to be harmful, but flu vaccines that do not contain thimerosal are available. There is no live flu virus in flu shots. They cannot cause the flu. There are many flu viruses, and they are always changing. Each year a new flu vaccine is made to protect against three or four viruses that are likely  to cause disease in the upcoming flu season. But even when the vaccine doesn't exactly match these viruses, it may still provide some protection. Flu vaccine cannot prevent:  flu that is caused by a virus not covered by the vaccine, or  illnesses that look like flu but are not.  It takes about 2 weeks for protection to develop after vaccination, and protection lasts through the flu season. 3. Some people should not get this vaccine Tell the person who is giving you the vaccine:  If you have any severe, life-threatening allergies. If you ever had a life-threatening allergic reaction after a dose of flu vaccine, or have a severe allergy to any part of this vaccine, you may be advised not to get vaccinated. Most, but not all, types of flu vaccine contain a small amount of egg protein.  If you ever had Guillain-Barr Syndrome (also called GBS). Some people with a history of GBS should not get this vaccine. This should be discussed with your doctor.  If you are not feeling well. It is usually okay to get flu vaccine when you have a mild illness, but you might be asked to come back when you feel  better.  4. Risks of a vaccine reaction With any medicine, including vaccines, there is a chance of reactions. These are usually mild and go away on their own, but serious reactions are also possible. Most people who get a flu shot do not have any problems with it. Minor problems following a flu shot include:  soreness, redness, or swelling where the shot was given  hoarseness  sore, red or itchy eyes  cough  fever  aches  headache  itching  fatigue  If these problems occur, they usually begin soon after the shot and last 1 or 2 days. More serious problems following a flu shot can include the following:  There may be a small increased risk of Guillain-Barre Syndrome (GBS) after inactivated flu vaccine. This risk has been estimated at 1 or 2 additional cases per million people vaccinated. This is much lower than the risk of severe complications from flu, which can be prevented by flu vaccine.  Young children who get the flu shot along with pneumococcal vaccine (PCV13) and/or DTaP vaccine at the same time might be slightly more likely to have a seizure caused by fever. Ask your doctor for more information. Tell your doctor if a child who is getting flu vaccine has ever had a seizure.  Problems that could happen after any injected vaccine:  People sometimes faint after a medical procedure, including vaccination. Sitting or lying down for about 15 minutes can help prevent fainting, and injuries caused by a fall. Tell your doctor if you feel dizzy, or have vision changes or ringing in the ears.  Some people get severe pain in the shoulder and have difficulty moving the arm where a shot was given. This happens very rarely.  Any medication can cause a severe allergic reaction. Such reactions from a vaccine are very rare, estimated at about 1 in a million doses, and would happen within a few minutes to a few hours after the vaccination. As with any medicine, there is a very remote  chance of a vaccine causing a serious injury or death. The safety of vaccines is always being monitored. For more information, visit: http://www.aguilar.org/ 5. What if there is a serious reaction? What should I look for? Look for anything that concerns you, such as signs of a severe allergic reaction,  very high fever, or unusual behavior. Signs of a severe allergic reaction can include hives, swelling of the face and throat, difficulty breathing, a fast heartbeat, dizziness, and weakness. These would start a few minutes to a few hours after the vaccination. What should I do?  If you think it is a severe allergic reaction or other emergency that can't wait, call 9-1-1 and get the person to the nearest hospital. Otherwise, call your doctor.  Reactions should be reported to the Vaccine Adverse Event Reporting System (VAERS). Your doctor should file this report, or you can do it yourself through the VAERS web site at www.vaers.hhs.gov, or by calling 1-800-822-7967. ? VAERS does not give medical advice. 6. The National Vaccine Injury Compensation Program The National Vaccine Injury Compensation Program (VICP) is a federal program that was created to compensate people who may have been injured by certain vaccines. Persons who believe they may have been injured by a vaccine can learn about the program and about filing a claim by calling 1-800-338-2382 or visiting the VICP website at www.hrsa.gov/vaccinecompensation. There is a time limit to file a claim for compensation. 7. How can I learn more?  Ask your healthcare provider. He or she can give you the vaccine package insert or suggest other sources of information.  Call your local or state health department.  Contact the Centers for Disease Control and Prevention (CDC): ? Call 1-800-232-4636 (1-800-CDC-INFO) or ? Visit CDC's website at www.cdc.gov/flu Vaccine Information Statement, Inactivated Influenza Vaccine (05/29/2014) This information is  not intended to replace advice given to you by your health care provider. Make sure you discuss any questions you have with your health care provider. Document Released: 08/03/2006 Document Revised: 06/29/2016 Document Reviewed: 06/29/2016 Elsevier Interactive Patient Education  2017 [employer] Second Trimester of Pregnancy The second trimester is from week 13 through week 28, month 4 through 6. This is often the time in pregnancy that you feel your best. Often times, morning sickness has lessened or quit. You may have more energy, and you may get hungry more often. Your unborn baby (fetus) is growing rapidly. At the end of the sixth month, he or she is about 9 inches long and weighs about 1 pounds. You will likely feel the baby move (quickening) between 18 and 20 weeks of pregnancy. Follow these instructions at home:  Avoid all smoking, herbs, and alcohol. Avoid drugs not approved by your doctor.  Do not use any tobacco products, including cigarettes, chewing tobacco, and electronic cigarettes. If you need help quitting, ask your doctor. You may get counseling or other support to help you quit.  Only take medicine as told by your doctor. Some medicines are safe and some are not during pregnancy.  Exercise only as told by your doctor. Stop exercising if you start having cramps.  Eat regular, healthy meals.  Wear a good support bra if your breasts are tender.  Do not use hot tubs, steam rooms, or saunas.  Wear your seat belt when driving.  Avoid raw meat, uncooked cheese, and liter boxes and soil used by cats.  Take your prenatal vitamins.  Take 1500-2000 milligrams of calcium daily starting at the 20th week of pregnancy until you deliver your baby.  Try taking medicine that helps you poop (stool softener) as needed, and if your doctor approves. Eat more fiber by eating fresh fruit, vegetables, and whole grains. Drink enough fluids to keep your pee (urine) clear or pale  yellow.  Take warm water baths (sitz baths)   to soothe pain or discomfort caused by hemorrhoids. Use hemorrhoid cream if your doctor approves.  If you have puffy, bulging veins (varicose veins), wear support hose. Raise (elevate) your feet for 15 minutes, 3-4 times a day. Limit salt in your diet.  Avoid heavy lifting, wear low heals, and sit up straight.  Rest with your legs raised if you have leg cramps or low back pain.  Visit your dentist if you have not gone during your pregnancy. Use a soft toothbrush to brush your teeth. Be gentle when you floss.  You can have sex (intercourse) unless your doctor tells you not to.  Go to your doctor visits. Get help if:  You feel dizzy.  You have mild cramps or pressure in your lower belly (abdomen).  You have a nagging pain in your belly area.  You continue to feel sick to your stomach (nauseous), throw up (vomit), or have watery poop (diarrhea).  You have bad smelling fluid coming from your vagina.  You have pain with peeing (urination). Get help right away if:  You have a fever.  You are leaking fluid from your vagina.  You have spotting or bleeding from your vagina.  You have severe belly cramping or pain.  You lose or gain weight rapidly.  You have trouble catching your breath and have chest pain.  You notice sudden or extreme puffiness (swelling) of your face, hands, ankles, feet, or legs.  You have not felt the baby move in over an hour.  You have severe headaches that do not go away with medicine.  You have vision changes. This information is not intended to replace advice given to you by your health care provider. Make sure you discuss any questions you have with your health care provider. Document Released: 01/03/2010 Document Revised: 03/16/2016 Document Reviewed: 12/10/2012 Elsevier Interactive Patient Education  2017 [place] of Pregnancy The second trimester is from week 13 through week  28, month 4 through 6. This is often the time in pregnancy that you feel your best. Often times, morning sickness has lessened or quit. You may have more energy, and you may get hungry more often. Your unborn baby (fetus) is growing rapidly. At the end of the sixth month, he or she is about 9 inches long and weighs about 1 pounds. You will likely feel the baby move (quickening) between 18 and 20 weeks of pregnancy. Follow these instructions at home:  Avoid all smoking, herbs, and alcohol. Avoid drugs not approved by your doctor.  Do not use any tobacco products, including cigarettes, chewing tobacco, and electronic cigarettes. If you need help quitting, ask your doctor. You may get counseling or other support to help you quit.  Only take medicine as told by your doctor. Some medicines are safe and some are not during pregnancy.  Exercise only as told by your doctor. Stop exercising if you start having cramps.  Eat regular, healthy meals.  Wear a good support bra if your breasts are tender.  Do not use hot tubs, steam rooms, or saunas.  Wear your seat belt when driving.  Avoid raw meat, uncooked cheese, and liter boxes and soil used by cats.  Take your prenatal vitamins.  Take 1500-2000 milligrams of calcium daily starting at the 20th week of pregnancy until you deliver your baby.  Try taking medicine that helps you poop (stool softener) as needed, and if your doctor approves. Eat more fiber by eating fresh fruit, vegetables, and whole grains. Drink  enough fluids to keep your pee (urine) clear or pale yellow.  Take warm water baths (sitz baths) to soothe pain or discomfort caused by hemorrhoids. Use hemorrhoid cream if your doctor approves.  If you have puffy, bulging veins (varicose veins), wear support hose. Raise (elevate) your feet for 15 minutes, 3-4 times a day. Limit salt in your diet.  Avoid heavy lifting, wear low heals, and sit up straight.  Rest with your legs raised if  you have leg cramps or low back pain.  Visit your dentist if you have not gone during your pregnancy. Use a soft toothbrush to brush your teeth. Be gentle when you floss.  You can have sex (intercourse) unless your doctor tells you not to.  Go to your doctor visits. Get help if:  You feel dizzy.  You have mild cramps or pressure in your lower belly (abdomen).  You have a nagging pain in your belly area.  You continue to feel sick to your stomach (nauseous), throw up (vomit), or have watery poop (diarrhea).  You have bad smelling fluid coming from your vagina.  You have pain with peeing (urination). Get help right away if:  You have a fever.  You are leaking fluid from your vagina.  You have spotting or bleeding from your vagina.  You have severe belly cramping or pain.  You lose or gain weight rapidly.  You have trouble catching your breath and have chest pain.  You notice sudden or extreme puffiness (swelling) of your face, hands, ankles, feet, or legs.  You have not felt the baby move in over an hour.  You have severe headaches that do not go away with medicine.  You have vision changes. This information is not intended to replace advice given to you by your health care provider. Make sure you discuss any questions you have with your health care provider. Document Released: 01/03/2010 Document Revised: 03/16/2016 Document Reviewed: 12/10/2012 Elsevier Interactive Patient Education  2017 [employer]vier Inc. Morning Sickness Morning sickness is when you feel sick to your stomach (nauseous) during pregnancy. You may feel sick to your stomach and throw up (vomit). You may feel sick in the morning, but you can feel this way any time of day. Some women feel very sick to their stomach and cannot stop throwing up (hyperemesis gravidarum). Follow these instructions at home:  Only take medicines as told by your doctor.  Take multivitamins as told by your doctor. Taking  multivitamins before getting pregnant can stop or lessen the harshness of morning sickness.  Eat dry toast or unsalted crackers before getting out of bed.  Eat 5 to 6 small meals a day.  Eat dry and bland foods like rice and baked potatoes.  Do not drink liquids with meals. Drink between meals.  Do not eat greasy, fatty, or spicy foods.  Have someone cook for you if the smell of food causes you to feel sick or throw up.  If you feel sick to your stomach after taking prenatal vitamins, take them at night or with a snack.  Eat protein when you need a snack (nuts, yogurt, cheese).  Eat unsweetened gelatins for dessert.  Wear a bracelet used for sea sickness (acupressure wristband).  Go to a doctor that puts thin needles into certain body points (acupuncture) to improve how you feel.  Do not smoke.  Use a humidifier to keep the air in your house free of odors.  Get lots of fresh air. Contact a doctor if:  You need medicine to feel better.  You feel dizzy or lightheaded.  You are losing weight. Get help right away if:  You feel very sick to your stomach and cannot stop throwing up.  You pass out (faint). This information is not intended to replace advice given to you by your health care provider. Make sure you discuss any questions you have with your health care provider. Document Released: 11/16/2004 Document Revised: 03/16/2016 Document Reviewed: 03/26/2013 Elsevier Interactive Patient Education  2017 [employer]. How a Baby Grows During Pregnancy Pregnancy begins when a female's sperm enters a female's egg (fertilization). This happens in one of the tubes (fallopian tubes) that connect the ovaries to the womb (uterus). The fertilized egg is called an embryo until it reaches 10 weeks. From 10 weeks until birth, it is called a fetus. The fertilized egg moves down the fallopian tube to the uterus. Then it implants into the lining of the uterus and begins to grow. The  developing fetus receives oxygen and nutrients through the pregnant woman's bloodstream and the tissues that grow (placenta) to support the fetus. The placenta is the life support system for the fetus. It provides nutrition and removes waste. Learning as much as you can about your pregnancy and how your baby is developing can help you enjoy the experience. It can also make you aware of when there might be a problem and when to ask questions. How long does a typical pregnancy last? A pregnancy usually lasts 280 days, or about 40 weeks. Pregnancy is divided into three trimesters:  First trimester: 0-13 weeks.  Second trimester: 14-27 weeks.  Third trimester: 28-40 weeks.  The day when your baby is considered ready to be born (full term) is your estimated date of delivery. How does my baby develop month by month? First month  The fertilized egg attaches to the inside of the uterus.  Some cells will form the placenta. Others will form the fetus.  The arms, legs, brain, spinal cord, lungs, and heart begin to develop.  At the end of the first month, the heart begins to beat.  Second month  The bones, inner ear, eyelids, hands, and feet form.  The genitals develop.  By the end of 8 weeks, all major organs are developing.  Third month  All of the internal organs are forming.  Teeth develop below the gums.  Bones and muscles begin to grow. The spine can flex.  The skin is transparent.  Fingernails and toenails begin to form.  Arms and legs continue to grow longer, and hands and feet develop.  The fetus is about 3 in (7.6 cm) long.  Fourth month  The placenta is completely formed.  The external sex organs, neck, outer ear, eyebrows, eyelids, and fingernails are formed.  The fetus can hear, swallow, and move its arms and legs.  The kidneys begin to produce urine.  The skin is covered with a white waxy coating (vernix) and very fine hair (lanugo).  Fifth month  The  fetus moves around more and can be felt for the first time (quickening).  The fetus starts to sleep and wake up and may begin to suck its finger.  The nails grow to the end of the fingers.  The organ in the digestive system that makes bile (gallbladder) functions and helps to digest the nutrients.  If your baby is a girl, eggs are present in her ovaries. If your baby is a boy, testicles start to move down into his  scrotum.  Sixth month  The lungs are formed, but the fetus is not yet able to breathe.  The eyes open. The brain continues to develop.  Your baby has fingerprints and toe prints. Your baby's hair grows thicker.  At the end of the second trimester, the fetus is about 9 in (22.9 cm) long.  Seventh month  The fetus kicks and stretches.  The eyes are developed enough to sense changes in light.  The hands can make a grasping motion.  The fetus responds to sound.  Eighth month  All organs and body systems are fully developed and functioning.  Bones harden and taste buds develop. The fetus may hiccup.  Certain areas of the brain are still developing. The skull remains soft.  Ninth month  The fetus gains about  lb (0.23 kg) each week.  The lungs are fully developed.  Patterns of sleep develop.  The fetus's head typically moves into a head-down position (vertex) in the uterus to prepare for birth. If the buttocks move into a vertex position instead, the baby is breech.  The fetus weighs 6-9 lbs (2.72-4.08 kg) and is 19-20 in (48.26-50.8 cm) long.  What can I do to have a healthy pregnancy and help my baby develop? Eating and Drinking  Eat a healthy diet. ? Talk with your health care provider to make sure that you are getting the nutrients that you and your baby need. ? Visit www.BuildDNA.es to learn about creating a healthy diet.  Gain a healthy amount of weight during pregnancy as advised by your health care provider. This is usually 25-35 pounds. You  may need to: ? Gain more if you were underweight before getting pregnant or if you are pregnant with more than one baby. ? Gain less if you were overweight or obese when you got pregnant.  Medicines and Vitamins  Take prenatal vitamins as directed by your health care provider. These include vitamins such as folic acid, iron, calcium, and vitamin D. They are important for healthy development.  Take medicines only as directed by your health care provider. Read labels and ask a pharmacist or your health care provider whether over-the-counter medicines, supplements, and prescription drugs are safe to take during pregnancy.  Activities  Be physically active as advised by your health care provider. Ask your health care provider to recommend activities that are safe for you to do, such as walking or swimming.  Do not participate in strenuous or extreme sports.  Lifestyle  Do not drink alcohol.  Do not use any tobacco products, including cigarettes, chewing tobacco, or electronic cigarettes. If you need help quitting, ask your health care provider.  Do not use illegal drugs.  Safety  Avoid exposure to mercury, lead, or other heavy metals. Ask your health care provider about common sources of these heavy metals.  Avoid listeria infection during pregnancy. Follow these precautions: ? Do not eat soft cheeses or deli meats. ? Do not eat hot dogs unless they have been warmed up to the point of steaming, such as in the microwave oven. ? Do not drink unpasteurized milk.  Avoid toxoplasmosis infection during pregnancy. Follow these precautions: ? Do not change your cat's litter box, if you have a cat. Ask someone else to do this for you. ? Wear gardening gloves while working in the yard.  General Instructions  Keep all follow-up visits as directed by your health care provider. This is important. This includes prenatal care and screening tests.  Manage any chronic  health conditions. Work  closely with your health care provider to keep conditions, such as diabetes, under control.  How do I know if my baby is developing well? At each prenatal visit, your health care provider will do several different tests to check on your health and keep track of your baby's development. These include:  Fundal height. ? Your health care provider will measure your growing belly from top to bottom using a tape measure. ? Your health care provider will also feel your belly to determine your baby's position.  Heartbeat. ? An ultrasound in the first trimester can confirm pregnancy and show a heartbeat, depending on how far along you are. ? Your health care provider will check your baby's heart rate at every prenatal visit. ? As you get closer to your delivery date, you may have regular fetal heart rate monitoring to make sure that your baby is not in distress.  Second trimester ultrasound. ? This ultrasound checks your baby's development. It also indicates your baby's gender.  What should I do if I have concerns about my baby's development? Always talk with your health care provider about any concerns that you may have. This information is not intended to replace advice given to you by your health care provider. Make sure you discuss any questions you have with your health care provider. Document Released: 03/27/2008 Document Revised: 03/16/2016 Document Reviewed: 03/18/2014 Elsevier Interactive Patient Education  2018 [place] of Pregnancy The first trimester of pregnancy is from week 1 until the end of week 13 (months 1 through 3). During this time, your baby will begin to develop inside you. At 6-8 weeks, the eyes and face are formed, and the heartbeat can be seen on ultrasound. At the end of 12 weeks, all the baby's organs are formed. Prenatal care is all the medical care you receive before the birth of your baby. Make sure you get good prenatal care and follow all of your  doctor's instructions. Follow these instructions at home: Medicines  Take over-the-counter and prescription medicines only as told by your doctor. Some medicines are safe and some medicines are not safe during pregnancy.  Take a prenatal vitamin that contains at least 600 micrograms (mcg) of folic acid.  If you have trouble pooping (constipation), take medicine that will make your stool soft (stool softener) if your doctor approves. Eating and drinking  Eat regular, healthy meals.  Your doctor will tell you the amount of weight gain that is right for you.  Avoid raw meat and uncooked cheese.  If you feel sick to your stomach (nauseous) or throw up (vomit): ? Eat 4 or 5 small meals a day instead of 3 large meals. ? Try eating a few soda crackers. ? Drink liquids between meals instead of during meals.  To prevent constipation: ? Eat foods that are high in fiber, like fresh fruits and vegetables, whole grains, and beans. ? Drink enough fluids to keep your pee (urine) clear or pale yellow. Activity  Exercise only as told by your doctor. Stop exercising if you have cramps or pain in your lower belly (abdomen) or low back.  Do not exercise if it is too hot, too humid, or if you are in a place of great height (high altitude).  Try to avoid standing for long periods of time. Move your legs often if you must stand in one place for a long time.  Avoid heavy lifting.  Wear low-heeled shoes. Sit and stand up straight.  You can have sex unless your doctor tells you not to. Relieving pain and discomfort  Wear a good support bra if your breasts are sore.  Take warm water baths (sitz baths) to soothe pain or discomfort caused by hemorrhoids. Use hemorrhoid cream if your doctor says it is okay.  Rest with your legs raised if you have leg cramps or low back pain.  If you have puffy, bulging veins (varicose veins) in your legs: ? Wear support hose or compression stockings as told by your  doctor. ? Raise (elevate) your feet for 15 minutes, 3-4 times a day. ? Limit salt in your food. Prenatal care  Schedule your prenatal visits by the twelfth week of pregnancy.  Write down your questions. Take them to your prenatal visits.  Keep all your prenatal visits as told by your doctor. This is important. Safety  Wear your seat belt at all times when driving.  Make a list of emergency phone numbers. The list should include numbers for family, friends, the hospital, and police and fire departments. General instructions  Ask your doctor for a referral to a local prenatal class. Begin classes no later than at the start of month 6 of your pregnancy.  Ask for help if you need counseling or if you need help with nutrition. Your doctor can give you advice or tell you where to go for help.  Do not use hot tubs, steam rooms, or saunas.  Do not douche or use tampons or scented sanitary pads.  Do not cross your legs for long periods of time.  Avoid all herbs and alcohol. Avoid drugs that are not approved by your doctor.  Do not use any tobacco products, including cigarettes, chewing tobacco, and electronic cigarettes. If you need help quitting, ask your doctor. You may get counseling or other support to help you quit.  Avoid cat litter boxes and soil used by cats. These carry germs that can cause birth defects in the baby and can cause a loss of your baby (miscarriage) or stillbirth.  Visit your dentist. At home, brush your teeth with a soft toothbrush. Be gentle when you floss. Contact a doctor if:  You are dizzy.  You have mild cramps or pressure in your lower belly.  You have a nagging pain in your belly area.  You continue to feel sick to your stomach, you throw up, or you have watery poop (diarrhea).  You have a bad smelling fluid coming from your vagina.  You have pain when you pee (urinate).  You have increased puffiness (swelling) in your face, hands, legs, or  ankles. Get help right away if:  You have a fever.  You are leaking fluid from your vagina.  You have spotting or bleeding from your vagina.  You have very bad belly cramping or pain.  You gain or lose weight rapidly.  You throw up blood. It may look like coffee grounds.  You are around people who have [place] measles, fifth disease, or chickenpox.  You have a very bad headache.  You have shortness of breath.  You have any kind of trauma, such as from a fall or a car accident. Summary  The first trimester of pregnancy is from week 1 until the end of week 13 (months 1 through 3).  To take care of yourself and your unborn baby, you will need to eat healthy meals, take medicines only if your doctor tells you to do so, and do activities that are safe for  you and your baby.  Keep all follow-up visits as told by your doctor. This is important as your doctor will have to ensure that your baby is healthy and growing well. This information is not intended to replace advice given to you by your health care provider. Make sure you discuss any questions you have with your health care provider. Document Released: 03/27/2008 Document Revised: 10/17/2016 Document Reviewed: 10/17/2016 Elsevier Interactive Patient Education  2017 [place]. Commonly Asked Questions During Pregnancy  Cats: A parasite can be excreted in cat feces.  To avoid exposure you need to have another person empty the little box.  If you must empty the litter box you will need to wear gloves.  Wash your hands after handling your cat.  This parasite can also be found in raw or undercooked meat so this should also be avoided.  Colds, Sore Throats, Flu: Please check your medication sheet to see what you can take for symptoms.  If your symptoms are unrelieved by these medications please call the office.  Dental Work: Most any dental work Investment banker, corporate recommends is permitted.  X-rays should only be taken during the first  trimester if absolutely necessary.  Your abdomen should be shielded with a lead apron during all x-rays.  Please notify your provider prior to receiving any x-rays.  Novocaine is fine; gas is not recommended.  If your dentist requires a note from [place] prior to dental work please call the office and we will provide one for you.  Exercise: Exercise is an important part of staying healthy during your pregnancy.  You may continue most exercises you were accustomed to prior to pregnancy.  Later in your pregnancy you will most likely notice you have difficulty with activities requiring balance like riding a bicycle.  It is important that you listen to your body and avoid activities that put you at a higher risk of falling.  Adequate rest and staying well hydrated are a must!  If you have questions about the safety of specific activities ask your provider.    Exposure to Children with illness: Try to avoid obvious exposure; report any symptoms to [place] when noted,  If you have chicken pos, red measles or mumps, you should be immune to these diseases.   Please do not take any vaccines while pregnant unless you have checked with your OB provider.  Fetal Movement: After 28 weeks we recommend you do "kick counts" twice daily.  Lie or sit down in a calm quiet environment and count your baby movements "kicks".  You should feel your baby at least 10 times per hour.  If you have not felt 10 kicks within the first hour get up, walk around and have something sweet to eat or drink then repeat for an additional hour.  If count remains less than 10 per hour notify your provider.  Fumigating: Follow your pest control agent's advice as to how long to stay out of your home.  Ventilate the area well before re-entering.  Hemorrhoids:   Most over-the-counter preparations can be used during pregnancy.  Check your medication to see what is safe to use.  It is important to use a stool softener or fiber in your diet and to drink lots of  liquids.  If hemorrhoids seem to be getting worse please call the office.   Hot Tubs:  Hot tubs Jacuzzis and saunas are not recommended while pregnant.  These increase your internal body temperature and should be avoided.  Intercourse:  Sexual intercourse is safe during pregnancy as long as you are comfortable, unless otherwise advised by your provider.  Spotting may occur after intercourse; report any bright red bleeding that is heavier than spotting.  Labor:  If you know that you are in labor, please go to the hospital.  If you are unsure, please call the office and let us help you decide what to do.  Lifting, straining, etc:  If your job requires heavy lifting or straining please check with your provider for any limitations.  Generally, you should not lift items heavier than that you can lift simply with your hands and arms (no back muscles)  Painting:  Paint fumes do not harm your pregnancy, but may make you ill and should be avoided if possible.  Latex or water based paints have less odor than oils.  Use adequate ventilation while painting.  Permanents & Hair Color:  Chemicals in hair dyes are not recommended as they cause increase hair dryness which can increase hair loss during pregnancy.  " Highlighting" and permanents are allowed.  Dye may be absorbed differently and permanents may not hold as well during pregnancy.  Sunbathing:  Use a sunscreen, as skin burns easily during pregnancy.  Drink plenty of fluids; avoid over heating.  Tanning Beds:  Because their possible side effects are still unknown, tanning beds are not recommended.  Ultrasound Scans:  Routine ultrasounds are performed at approximately 20 weeks.  You will be able to see your baby's general anatomy an if you would like to know the gender this can usually be determined as well.  If it is questionable when you conceived you may also receive an ultrasound early in your pregnancy for dating purposes.  Otherwise ultrasound exams  are not routinely performed unless there is a medical necessity.  Although you can request a scan we ask that you pay for it when conducted because insurance does not cover " patient request" scans.  Work: If your pregnancy proceeds without complications you may work until your due date, unless your physician or employer advises otherwise.  Round Ligament Pain/Pelvic Discomfort:  Sharp, shooting pains not associated with bleeding are fairly common, usually occurring in the second trimester of pregnancy.  They tend to be worse when standing up or when you remain standing for long periods of time.  These are the result of pressure of certain pelvic ligaments called "round ligaments".  Rest, Tylenol and heat seem to be the most effective relief.  As the womb and fetus grow, they rise out of the pelvis and the discomfort improves.  Please notify the office if your pain seems different than that described.  It may represent a more serious condition.  Common Medications Safe in Pregnancy  Acne:      Constipation:  Benzoyl Peroxide     Colace  Clindamycin      Dulcolax Suppository  Topica Erythromycin     Fibercon  Salicylic Acid      Metamucil         Miralax AVOID:        Senakot   Accutane    Cough:  Retin-A       Cough Drops  Tetracycline      Phenergan w/ Codeine if Rx  Minocycline      Robitussin (Plain & DM)  Antibiotics:     Crabs/Lice:  Ceclor       RID  Cephalosporins    AVOID:  E-Mycins      Kwell  Keflex  Macrobid/Macrodantin   Diarrhea:  Penicillin      Kao-Pectate  Zithromax      Imodium AD         PUSH FLUIDS AVOID:       Cipro     Fever:  Tetracycline      Tylenol (Regular or Extra  Minocycline       Strength)  Levaquin      Extra Strength-Do not          Exceed 8 tabs/24 hrs Caffeine:        '200mg'$ /day (equiv. To 1 cup of coffee or  approx. 3 12 oz  sodas)         Gas: Cold/Hayfever:       Gas-X  Benadryl      Mylicon  Claritin       Phazyme  **Claritin-D        Chlor-Trimeton    Headaches:  Dimetapp      ASA-Free Excedrin  Drixoral-Non-Drowsy     Cold Compress  Mucinex (Guaifenasin)     Tylenol (Regular or Extra  Sudafed/Sudafed-12 Hour     Strength)  **Sudafed PE Pseudoephedrine   Tylenol Cold & Sinus     Vicks Vapor Rub  Zyrtec  **AVOID if Problems With Blood Pressure         Heartburn: Avoid lying down for at least 1 hour after meals  Aciphex      Maalox     Rash:  Milk of Magnesia     Benadryl    Mylanta       1% Hydrocortisone Cream  Pepcid  Pepcid Complete   Sleep Aids:  Prevacid      Ambien   Prilosec       Benadryl  Rolaids       Chamomile Tea  Tums (Limit 4/day)     Unisom  Zantac       Tylenol PM         Warm milk-add vanilla or  Hemorrhoids:       Sugar for taste  Anusol/Anusol H.C.  (RX: Analapram 2.5%)  Sugar Substitutes:  Hydrocortisone OTC     Ok in moderation  Preparation H      Tucks        Vaseline lotion applied to tissue with wiping    Herpes:     Throat:  Acyclovir      Oragel  Famvir  Valtrex     Vaccines:         Flu Shot Leg Cramps:       *Gardasil  Benadryl      Hepatitis A         Hepatitis B Nasal Spray:       Pneumovax  Saline Nasal Spray     Polio Booster         Tetanus Nausea:       Tuberculosis test or PPD  Vitamin B6 25 mg TID   AVOID:    Dramamine      *Gardasil  Emetrol       Live Poliovirus  Ginger Root 250 mg QID    MMR (measles, mumps &  High Complex Carbs @ Bedtime    rebella)  Sea Bands-Accupressure    Varicella (Chickenpox)  Unisom 1/2 tab TID     *No known complications           If received before Pain:         Known pregnancy;   Darvocet  Resume series after  Lortab        Delivery  Percocet    Yeast:   Tramadol      Femstat  Tylenol 3      Gyne-lotrimin  Ultram       Monistat  Vicodin           MISC:         All  Sunscreens           Hair Coloring/highlights          Insect Repellant's          (Including DEET)         [facility]  [place], [place], [place] [address]  Phone: [phone]   [facility] (second location)  [address] [place], [place] [address]  Phone: [phone]   [facility] [place], [place], [place] [address] Phone: [phone]   [address] [place],  [address]  Phone: [phone]

## 2018-07-29 NOTE — Progress Notes (Signed)
Eulah Pont presents for NOB nurse interview visit. Pregnancy confirmation done 07/15/18 G1P0, EDD 03/10/19 per ultrasound Pregnancy education material explained and given. 0 cats in home. NOB labs ordered. PNV encouraged. Genetic screening options discussed, Pt very interested in having done. Patient may discuss with the provider. Patient to follow up with provider in 4 weeks for NOB physical. Financial policy discussed and FMLA form explained and signed. All questions answered.

## 2018-08-02 ENCOUNTER — Ambulatory Visit (INDEPENDENT_AMBULATORY_CARE_PROVIDER_SITE_OTHER): Payer: 59 | Admitting: Certified Nurse Midwife

## 2018-08-02 VITALS — BP 133/82 | HR 103 | Ht 66.0 in | Wt 152.0 lb

## 2018-08-02 DIAGNOSIS — Z202 Contact with and (suspected) exposure to infections with a predominantly sexual mode of transmission: Secondary | ICD-10-CM

## 2018-08-02 DIAGNOSIS — Z3401 Encounter for supervision of normal first pregnancy, first trimester: Secondary | ICD-10-CM

## 2018-08-03 LAB — URINALYSIS, ROUTINE W REFLEX MICROSCOPIC
Bilirubin, UA: NEGATIVE
Glucose, UA: NEGATIVE
Ketones, UA: NEGATIVE
LEUKOCYTES UA: NEGATIVE
NITRITE UA: NEGATIVE
PH UA: 7 (ref 5.0–7.5)
Protein, UA: NEGATIVE
RBC UA: NEGATIVE
SPEC GRAV UA: 1.005 (ref 1.005–1.030)
UUROB: 0.2 mg/dL (ref 0.2–1.0)

## 2018-08-03 LAB — DRUG PROFILE, UR, 9 DRUGS (LABCORP)
Amphetamines, Urine: NEGATIVE ng/mL
Barbiturate Quant, Ur: NEGATIVE ng/mL
Benzodiazepine Quant, Ur: NEGATIVE ng/mL
CANNABINOID QUANT UR: NEGATIVE ng/mL
COCAINE (METAB.): NEGATIVE ng/mL
METHADONE SCREEN, URINE: NEGATIVE ng/mL
Opiate Quant, Ur: NEGATIVE ng/mL
PCP QUANT UR: NEGATIVE ng/mL
Propoxyphene: NEGATIVE ng/mL

## 2018-08-03 LAB — NICOTINE SCREEN, URINE: COTININE UR QL SCN: NEGATIVE ng/mL

## 2018-08-04 DIAGNOSIS — Z23 Encounter for immunization: Secondary | ICD-10-CM | POA: Diagnosis not present

## 2018-08-04 LAB — CULTURE, OB URINE

## 2018-08-04 LAB — GC/CHLAMYDIA PROBE AMP
Chlamydia trachomatis, NAA: NEGATIVE
NEISSERIA GONORRHOEAE BY PCR: NEGATIVE

## 2018-08-04 LAB — URINE CULTURE, OB REFLEX

## 2018-08-05 LAB — ABO AND RH: Rh Factor: POSITIVE

## 2018-08-05 LAB — HEPATITIS B SURFACE ANTIGEN: Hepatitis B Surface Ag: NEGATIVE

## 2018-08-05 LAB — RPR: RPR Ser Ql: NONREACTIVE

## 2018-08-05 LAB — RUBELLA SCREEN: Rubella Antibodies, IGG: 5.21 index (ref >0.99)

## 2018-08-05 LAB — PARVOVIRUS B19 ANTIBODY, IGG AND IGM
Parvovirus B19 IgG: 6.8 index — ABNORMAL HIGH (ref 0.0–0.8)
Parvovirus B19 IgM: 0.2 index (ref 0.0–0.8)

## 2018-08-05 LAB — HIV ANTIBODY (ROUTINE TESTING W REFLEX): HIV SCREEN 4TH GENERATION: NONREACTIVE

## 2018-08-05 LAB — VARICELLA ZOSTER ANTIBODY, IGG: Varicella zoster IgG: 1382 index (ref >165)

## 2018-08-05 LAB — HGB SOLU + RFLX FRAC: Sickle Solubility Test - HGBRFX: NEGATIVE

## 2018-08-08 ENCOUNTER — Encounter: Payer: Self-pay | Admitting: Certified Nurse Midwife

## 2018-08-08 DIAGNOSIS — Z8619 Personal history of other infectious and parasitic diseases: Secondary | ICD-10-CM | POA: Insufficient documentation

## 2018-08-08 NOTE — Progress Notes (Signed)
I have reviewed the record and concur with patient management and plan of care.    Jenkins Michelle Lawhorn, CNM [facility] 

## 2018-08-30 ENCOUNTER — Ambulatory Visit (INDEPENDENT_AMBULATORY_CARE_PROVIDER_SITE_OTHER): Payer: 59 | Admitting: Certified Nurse Midwife

## 2018-08-30 VITALS — BP 118/80 | HR 106 | Wt 150.5 lb

## 2018-08-30 DIAGNOSIS — Z3A12 12 weeks gestation of pregnancy: Secondary | ICD-10-CM

## 2018-08-30 DIAGNOSIS — Z3401 Encounter for supervision of normal first pregnancy, first trimester: Secondary | ICD-10-CM

## 2018-08-30 DIAGNOSIS — Z1379 Encounter for other screening for genetic and chromosomal anomalies: Secondary | ICD-10-CM

## 2018-08-30 DIAGNOSIS — Z130 Encounter for screening for diseases of the blood and blood-forming organs and certain disorders involving the immune mechanism: Secondary | ICD-10-CM | POA: Diagnosis not present

## 2018-08-30 NOTE — Progress Notes (Signed)
NEW OB HISTORY AND PHYSICAL  SUBJECTIVE:       Stacey Harrison is a 22 y.o. [id number] female, Patient's last menstrual period was 06/02/2018 (exact date)., Estimated Date of Delivery: 03/09/19, [redacted]w[redacted]d, presents today for establishment of Prenatal Care.  Endorses resolving nausea with occasional vomiting. Desires genetic screening. Requests midwifery care.   Denies difficulty breathing or respiratory distress, chest pain, abdominal pain, vaginal bleeding, dysuria, and leg pain or swelling.    Gynecologic History  Patient's last menstrual period was 06/02/2018 (exact date).  Period Cycle (Days): 28 Period Duration (Days): 4 Period Pattern: Regular Menstrual Flow: Moderate Menstrual Control: Tampon Dysmenorrhea: (!) Mild Dysmenorrhea Symptoms: Cramping  Contraception: none   Last Pap: 01/2016. Results were: normal  Obstetric History OB History  Gravida Para Term Preterm AB Living  1 0 0 0 0 0  SAB TAB Ectopic Multiple Live Births  0 0 0 0 0    # Outcome Date GA Lbr Len/2nd Weight Sex Delivery Anes PTL Lv  1 Current             Past Medical History:  Diagnosis Date  . Migraine     Past Surgical History:  Procedure Laterality Date  . WISDOM TOOTH EXTRACTION  2014    Current Outpatient Medications on File Prior to Visit  Medication Sig Dispense Refill  . Doxylamine-Pyridoxine ER (BONJESTA) 20-20 MG TBCR Take 20 mg by mouth 2 (two) times daily. 60 tablet 3  . Prenatal Vit-Fe Fumarate-FA (MULTIVITAMIN-PRENATAL) 27-0.8 MG TABS tablet Take 1 tablet by mouth daily at 12 noon.     No current facility-administered medications on file prior to visit.     Allergies  Allergen Reactions  . Hydrocodone Itching    Social History   Socioeconomic History  . Marital status: Married    Spouse name: Not on file  . Number of children: Not on file  . Years of education: Not on file  . Highest education level: Not on file  Occupational History  . Not on file  Social Needs  .  Financial resource strain: Not on file  . Food insecurity:    Worry: Not on file    Inability: Not on file  . Transportation needs:    Medical: Not on file    Non-medical: Not on file  Tobacco Use  . Smoking status: Never Smoker  . Smokeless tobacco: Never Used  Substance and Sexual Activity  . Alcohol use: Yes    Comment: rarely  . Drug use: No  . Sexual activity: Yes    Birth control/protection: None  Lifestyle  . Physical activity:    Days per week: 3 days    Minutes per session: 30 min  . Stress: Not on file  Relationships  . Social connections:    Talks on phone: Not on file    Gets together: Not on file    Attends religious service: Not on file    Active member of club or organization: Not on file    Attends meetings of clubs or organizations: Not on file    Relationship status: Not on file  . Intimate partner violence:    Fear of current or ex partner: Not on file    Emotionally abused: Not on file    Physically abused: Not on file    Forced sexual activity: Not on file  Other Topics Concern  . Not on file  Social History Narrative  . Not on file    Family History  Problem Relation Age of Onset  . Breast cancer Maternal Grandmother   . Ovarian cancer Neg Hx   . Colon cancer Neg Hx   . Diabetes Neg Hx     The following portions of the patient's history were reviewed and updated as appropriate: allergies, current medications, past OB history, past medical history, past surgical history, past family history, past social history, and problem list.    OBJECTIVE:  BP 118/80   Pulse (!) 106   Wt 150 lb 8 oz (68.3 kg)   LMP 06/02/2018 (Exact Date)   BMI 24.29 kg/m   Initial Physical Exam (New OB)  GENERAL APPEARANCE: alert, well appearing, in no apparent distress  HEAD: normocephalic, atraumatic  MOUTH: mucous membranes moist, pharynx normal without lesions  THYROID: no thyromegaly or masses present  BREASTS: no masses noted, no significant  tenderness, no palpable axillary nodes, no skin changes  LUNGS: clear to auscultation, no wheezes, rales or rhonchi, symmetric air entry  HEART: regular rate and rhythm, no murmurs  ABDOMEN: soft, nontender, nondistended, no abnormal masses, no epigastric pain and FHT present  EXTREMITIES: no redness or tenderness in the calves or thighs, no edema  SKIN: normal coloration and turgor, no rashes  LYMPH NODES: no adenopathy palpable  NEUROLOGIC: alert, oriented, normal speech, no focal findings or movement disorder noted  PELVIC EXAM EXTERNAL GENITALIA: normal appearing vulva with no masses, tenderness or lesions VAGINA: no abnormal discharge or lesions CERVIX: no lesions or cervical motion tenderness OB EXAM PELVIMETRY: appears adequate  ASSESSMENT: Normal pregnancy Desires genetic screening-[facility] orders  PLAN: Prenatal care New OB counseling: The patient has been given an overview regarding routine prenatal care. Recommendations regarding diet, weight gain, and exercise in pregnancy were given. Prenatal testing, optional genetic testing, and ultrasound use in pregnancy were reviewed.  Benefits of Breast Feeding were discussed. The patient is encouraged to consider nursing her baby post partum. See orders   Diona Fanti, CNM [facility], [facility]

## 2018-08-30 NOTE — Patient Instructions (Signed)
Round Ligament Pain The round ligament is a cord of muscle and tissue that helps to support the uterus. It can become a source of pain during pregnancy if it becomes stretched or twisted as the baby grows. The pain usually begins in the second trimester of pregnancy, and it can come and go until the baby is delivered. It is not a serious problem, and it does not cause harm to the baby. Round ligament pain is usually a short, sharp, and pinching pain, but it can also be a dull, lingering, and aching pain. The pain is felt in the lower side of the abdomen or in the groin. It usually starts deep in the groin and moves up to the outside of the hip area. Pain can occur with:  A sudden change in position.  Rolling over in bed.  Coughing or sneezing.  Physical activity.  Follow these instructions at home: Watch your condition for any changes. Take these steps to help with your pain:  When the pain starts, relax. Then try: ? Sitting down. ? Flexing your knees up to your abdomen. ? Lying on your side with one pillow under your abdomen and another pillow between your legs. ? Sitting in a warm bath for 15-20 minutes or until the pain goes away.  Take over-the-counter and prescription medicines only as told by your health care provider.  Move slowly when you sit and stand.  Avoid long walks if they cause pain.  Stop or lessen your physical activities if they cause pain.  Contact a health care provider if:  Your pain does not go away with treatment.  You feel pain in your back that you did not have before.  Your medicine is not helping. Get help right away if:  You develop a fever or chills.  You develop uterine contractions.  You develop vaginal bleeding.  You develop nausea or vomiting.  You develop diarrhea.  You have pain when you urinate. This information is not intended to replace advice given to you by your health care provider. Make sure you discuss any questions you have  with your health care provider. Document Released: 07/18/2008 Document Revised: 03/16/2016 Document Reviewed: 12/16/2014 Elsevier Interactive Patient Education  2018 [place]. Abdominal Pain During Pregnancy Belly (abdominal) pain is common during pregnancy. Most of the time, it is not a serious problem. Other times, it can be a sign that something is wrong with the pregnancy. Always tell your doctor if you have belly pain. Follow these instructions at home: Monitor your belly pain for any changes. The following actions may help you feel better:  Do not have sex (intercourse) or put anything in your vagina until you feel better.  Rest until your pain stops.  Drink clear fluids if you feel sick to your stomach (nauseous). Do not eat solid food until you feel better.  Only take medicine as told by your doctor.  Keep all doctor visits as told.  Get help right away if:  You are bleeding, leaking fluid, or pieces of tissue come out of your vagina.  You have more pain or cramping.  You keep throwing up (vomiting).  You have pain when you pee (urinate) or have blood in your pee.  You have a fever.  You do not feel your baby moving as much.  You feel very weak or feel like passing out.  You have trouble breathing, with or without belly pain.  You have a very bad headache and belly pain.  You have fluid leaking from your vagina and belly pain.  You keep having watery poop (diarrhea).  Your belly pain does not go away after resting, or the pain gets worse. This information is not intended to replace advice given to you by your health care provider. Make sure you discuss any questions you have with your health care provider. Document Released: 09/27/2009 Document Revised: 05/17/2016 Document Reviewed: 05/08/2013 Elsevier Interactive Patient Education  2018 [place]. Back Pain in Pregnancy Back pain during pregnancy is common. Back pain may be caused by several factors  that are related to changes during your pregnancy. Follow these instructions at home: Managing pain, stiffness, and swelling  If directed, apply ice for sudden (acute) back pain. ? Put ice in a plastic bag. ? Place a towel between your skin and the bag. ? Leave the ice on for 20 minutes, 2-3 times per day.  If directed, apply heat to the affected area before you exercise: ? Place a towel between your skin and the heat pack or heating pad. ? Leave the heat on for 20-30 minutes. ? Remove the heat if your skin turns bright red. This is especially important if you are unable to feel pain, heat, or cold. You may have a greater risk of getting burned. Activity  Exercise as told by your health care provider. Exercising is the best way to prevent or manage back pain.  Listen to your body when lifting. If lifting hurts, ask for help or bend your knees. This uses your leg muscles instead of your back muscles.  Squat down when picking up something from the floor. Do not bend over.  Only use bed rest as told by your health care provider. Bed rest should only be used for the most severe episodes of back pain. Standing, Sitting, and Lying Down  Do not stand in one place for long periods of time.  Use good posture when sitting. Make sure your head rests over your shoulders and is not hanging forward. Use a pillow on your lower back if necessary.  Try sleeping on your side, preferably the left side, with a pillow or two between your legs. If you are sore after a night's rest, your bed may be too soft. A firm mattress may provide more support for your back during pregnancy. General instructions  Do not wear high heels.  Eat a healthy diet. Try to gain weight within your health care provider's recommendations.  Use a maternity girdle, elastic sling, or back brace as told by your health care provider.  Take over-the-counter and prescription medicines only as told by your health care  provider.  Keep all follow-up visits as told by your health care provider. This is important. This includes any visits with any specialists, such as a physical therapist. Contact a health care provider if:  Your back pain interferes with your daily activities.  You have increasing pain in other parts of your body. Get help right away if:  You develop numbness, tingling, weakness, or problems with the use of your arms or legs.  You develop severe back pain that is not controlled with medicine.  You have a sudden change in bowel or bladder control.  You develop shortness of breath, dizziness, or you faint.  You develop nausea, vomiting, or sweating.  You have back pain that is a rhythmic, cramping pain similar to labor pains. Labor pain is usually 1-2 minutes apart, lasts for about 1 minute, and involves a bearing down feeling  or pressure in your pelvis.  You have back pain and your water breaks or you have vaginal bleeding.  You have back pain or numbness that travels down your leg.  Your back pain developed after you fell.  You develop pain on one side of your back.  You see blood in your urine.  You develop skin blisters in the area of your back pain. This information is not intended to replace advice given to you by your health care provider. Make sure you discuss any questions you have with your health care provider. Document Released: 01/17/2006 Document Revised: 03/16/2016 Document Reviewed: 06/23/2015 Elsevier Interactive Patient Education  2018 [place] for Pregnant Women While you are pregnant, your body will require additional nutrition to help support your growing baby. It is recommended that you consume:  150 additional calories each day during your first trimester.  300 additional calories each day during your second trimester.  300 additional calories each day during your third trimester.  Eating a healthy, well-balanced diet is very  important for your health and for your baby's health. You also have a higher need for some vitamins and minerals, such as folic acid, calcium, iron, and vitamin D. What do I need to know about eating during pregnancy?  Do not try to lose weight or go on a diet during pregnancy.  Choose healthy, nutritious foods. Choose  of a sandwich with a glass of milk instead of a candy bar or a high-calorie sugar-sweetened beverage.  Limit your overall intake of foods that have "empty calories." These are foods that have little nutritional value, such as sweets, desserts, candies, sugar-sweetened beverages, and fried foods.  Eat a variety of foods, especially fruits and vegetables.  Take a prenatal vitamin to help meet the additional needs during pregnancy, specifically for folic acid, iron, calcium, and vitamin D.  Remember to stay active. Ask your health care provider for exercise recommendations that are specific to you.  Practice good food safety and cleanliness, such as washing your hands before you eat and after you prepare raw meat. This helps to prevent foodborne illnesses, such as listeriosis, that can be very dangerous for your baby. Ask your health care provider for more information about listeriosis. What does 150 extra calories look like? Healthy options for an additional 150 calories each day could be any of the following:  Plain low-fat yogurt (6-8 oz) with  cup of berries.  1 apple with 2 teaspoons of peanut butter.  Cut-up vegetables with  cup of hummus.  Low-fat chocolate milk (8 oz or 1 cup).  1 string cheese with 1 medium orange.   of a peanut butter and jelly sandwich on whole-wheat bread (1 tsp of peanut butter).  For 300 calories, you could eat two of those healthy options each day. What is a healthy amount of weight to gain? The recommended amount of weight for you to gain is based on your pre-pregnancy BMI. If your pre-pregnancy BMI was:  Less than 18 (underweight),  you should gain 28-40 lb.  18-24.9 (normal), you should gain 25-35 lb.  25-29.9 (overweight), you should gain 15-25 lb.  Greater than 30 (obese), you should gain 11-20 lb.  What if I am having twins or multiples? Generally, pregnant women who will be having twins or multiples may need to increase their daily calories by 300-600 calories each day. The recommended range for total weight gain is 25-54 lb, depending on your pre-pregnancy BMI. Talk with your health care  provider for specific guidance about additional nutritional needs, weight gain, and exercise during your pregnancy. What foods can I eat? Grains Any grains. Try to choose whole grains, such as whole-wheat bread, oatmeal, or brown rice. Vegetables Any vegetables. Try to eat a variety of colors and types of vegetables to get a full range of vitamins and minerals. Remember to wash your vegetables well before eating. Fruits Any fruits. Try to eat a variety of colors and types of fruit to get a full range of vitamins and minerals. Remember to wash your fruits well before eating. Meats and Other Protein Sources Lean meats, including chicken, [place], fish, and lean cuts of beef, veal, or pork. Make sure that all meats are cooked to "well done." Tofu. Tempeh. Beans. Eggs. Peanut butter and other nut butters. Seafood, such as shrimp, crab, and lobster. If you choose fish, select types that are higher in omega-3 fatty acids, including salmon, herring, mussels, trout, sardines, and pollock. Make sure that all meats are cooked to food-safe temperatures. Dairy Pasteurized milk and milk alternatives. Pasteurized yogurt and pasteurized cheese. Cottage cheese. Sour cream. Beverages Water. Juices that contain 100% fruit juice or vegetable juice. Caffeine-free teas and decaffeinated coffee. Drinks that contain caffeine are okay to drink, but it is better to avoid caffeine. Keep your total caffeine intake to less than 200 mg each day (12 oz of coffee,  tea, or soda) or as directed by your health care provider. Condiments Any pasteurized condiments. Sweets and Desserts Any sweets and desserts. Fats and Oils Any fats and oils. The items listed above may not be a complete list of recommended foods or beverages. Contact your dietitian for more options. What foods are not recommended? Vegetables Unpasteurized (raw) vegetable juices. Fruits Unpasteurized (raw) fruit juices. Meats and Other Protein Sources Cured meats that have nitrates, such as bacon, salami, and hotdogs. Luncheon meats, bologna, or other deli meats (unless they are reheated until they are steaming hot). Refrigerated pate, meat spreads from a meat counter, smoked seafood that is found in the refrigerated section of a store. Raw fish, such as sushi or sashimi. High mercury content fish, such as tilefish, shark, swordfish, and king mackerel. Raw meats, such as tuna or beef tartare. Undercooked meats and poultry. Make sure that all meats are cooked to food-safe temperatures. Dairy Unpasteurized (raw) milk and any foods that have raw milk in them. Soft cheeses, such as feta, queso blanco, queso fresco, Brie, Camembert cheeses, blue-veined cheeses, and Panela cheese (unless it is made with pasteurized milk, which must be stated on the label). Beverages Alcohol. Sugar-sweetened beverages, such as sodas, teas, or energy drinks. Condiments Homemade fermented foods and drinks, such as pickles, sauerkraut, or kombucha drinks. (Store-bought pasteurized versions of these are okay.) Other Salads that are made in the store, such as ham salad, chicken salad, egg salad, tuna salad, and seafood salad. The items listed above may not be a complete list of foods and beverages to avoid. Contact your dietitian for more information. This information is not intended to replace advice given to you by your health care provider. Make sure you discuss any questions you have with your health care  provider. Document Released: 07/24/2014 Document Revised: 03/16/2016 Document Reviewed: 03/24/2014 Elsevier Interactive Patient Education  2018 [employer]. Morning Sickness Morning sickness is when you feel sick to your stomach (nauseous) during pregnancy. You may feel sick to your stomach and throw up (vomit). You may feel sick in the morning, but you can feel this way  any time of day. Some women feel very sick to their stomach and cannot stop throwing up (hyperemesis gravidarum). Follow these instructions at home:  Only take medicines as told by your doctor.  Take multivitamins as told by your doctor. Taking multivitamins before getting pregnant can stop or lessen the harshness of morning sickness.  Eat dry toast or unsalted crackers before getting out of bed.  Eat 5 to 6 small meals a day.  Eat dry and bland foods like rice and baked potatoes.  Do not drink liquids with meals. Drink between meals.  Do not eat greasy, fatty, or spicy foods.  Have someone cook for you if the smell of food causes you to feel sick or throw up.  If you feel sick to your stomach after taking prenatal vitamins, take them at night or with a snack.  Eat protein when you need a snack (nuts, yogurt, cheese).  Eat unsweetened gelatins for dessert.  Wear a bracelet used for sea sickness (acupressure wristband).  Go to a doctor that puts thin needles into certain body points (acupuncture) to improve how you feel.  Do not smoke.  Use a humidifier to keep the air in your house free of odors.  Get lots of fresh air. Contact a doctor if:  You need medicine to feel better.  You feel dizzy or lightheaded.  You are losing weight. Get help right away if:  You feel very sick to your stomach and cannot stop throwing up.  You pass out (faint). This information is not intended to replace advice given to you by your health care provider. Make sure you discuss any questions you have with your health care  provider. Document Released: 11/16/2004 Document Revised: 03/16/2016 Document Reviewed: 03/26/2013 Elsevier Interactive Patient Education  2017 [place]. Common Medications Safe in Pregnancy  Acne:      Constipation:  Benzoyl Peroxide     Colace  Clindamycin      Dulcolax Suppository  Topica Erythromycin     Fibercon  Salicylic Acid      Metamucil         Miralax AVOID:        Senakot   Accutane    Cough:  Retin-A       Cough Drops  Tetracycline      Phenergan w/ Codeine if Rx  Minocycline      Robitussin (Plain & DM)  Antibiotics:     Crabs/Lice:  Ceclor       RID  Cephalosporins    AVOID:  E-Mycins      Kwell  Keflex  Macrobid/Macrodantin   Diarrhea:  Penicillin      Kao-Pectate  Zithromax      Imodium AD         PUSH FLUIDS AVOID:       Cipro     Fever:  Tetracycline      Tylenol (Regular or Extra  Minocycline       Strength)  Levaquin      Extra Strength-Do not          Exceed 8 tabs/24 hrs Caffeine:        <256m/day (equiv. To 1 cup of coffee or  approx. 3 12 oz sodas)         Gas: Cold/Hayfever:       Gas-X  Benadryl      Mylicon  Claritin       Phazyme  **Claritin-D        Chlor-Trimeton  Headaches:  Dimetapp      ASA-Free Excedrin  Drixoral-Non-Drowsy     Cold Compress  Mucinex (Guaifenasin)     Tylenol (Regular or Extra  Sudafed/Sudafed-12 Hour     Strength)  **Sudafed PE Pseudoephedrine   Tylenol Cold & Sinus     Vicks Vapor Rub  Zyrtec  **AVOID if Problems With Blood Pressure         Heartburn: Avoid lying down for at least 1 hour after meals  Aciphex      Maalox     Rash:  Milk of Magnesia     Benadryl    Mylanta       1% Hydrocortisone Cream  Pepcid  Pepcid Complete   Sleep Aids:  Prevacid      Ambien   Prilosec       Benadryl  Rolaids       Chamomile Tea  Tums (Limit 4/day)     Unisom  Zantac       Tylenol PM         Warm milk-add vanilla or  Hemorrhoids:       Sugar for taste  Anusol/Anusol H.C.  (RX: Analapram  2.5%)  Sugar Substitutes:  Hydrocortisone OTC     Ok in moderation  Preparation H      Tucks        Vaseline lotion applied to tissue with wiping    Herpes:     Throat:  Acyclovir      Oragel  Famvir  Valtrex     Vaccines:         Flu Shot Leg Cramps:       *Gardasil  Benadryl      Hepatitis A         Hepatitis B Nasal Spray:       Pneumovax  Saline Nasal Spray     Polio Booster         Tetanus Nausea:       Tuberculosis test or PPD  Vitamin B6 25 mg TID   AVOID:    Dramamine      *Gardasil  Emetrol       Live Poliovirus  Ginger Root 250 mg QID    MMR (measles, mumps &  High Complex Carbs @ Bedtime    rebella)  Sea Bands-Accupressure    Varicella (Chickenpox)  Unisom 1/2 tab TID     *No known complications           If received before Pain:         Known pregnancy;   Darvocet       Resume series after  Lortab        Delivery  Percocet    Yeast:   Tramadol      Femstat  Tylenol 3      Gyne-lotrimin  Ultram       Monistat  Vicodin           MISC:         All Sunscreens           Hair Coloring/highlights          Insect Repellant's          (Including DEET)         Mystic Tans Second Trimester of Pregnancy The second trimester is from week 13 through week 28, month 4 through 6. This is often the time in pregnancy that you feel your best. Often times, morning sickness has lessened or quit. You  may have more energy, and you may get hungry more often. Your unborn baby (fetus) is growing rapidly. At the end of the sixth month, he or she is about 9 inches long and weighs about 1 pounds. You will likely feel the baby move (quickening) between 18 and 20 weeks of pregnancy. Follow these instructions at home:  Avoid all smoking, herbs, and alcohol. Avoid drugs not approved by your doctor.  Do not use any tobacco products, including cigarettes, chewing tobacco, and electronic cigarettes. If you need help quitting, ask your doctor. You may get counseling or other support to help you  quit.  Only take medicine as told by your doctor. Some medicines are safe and some are not during pregnancy.  Exercise only as told by your doctor. Stop exercising if you start having cramps.  Eat regular, healthy meals.  Wear a good support bra if your breasts are tender.  Do not use hot tubs, steam rooms, or saunas.  Wear your seat belt when driving.  Avoid raw meat, uncooked cheese, and liter boxes and soil used by cats.  Take your prenatal vitamins.  Take 1500-2000 milligrams of calcium daily starting at the 20th week of pregnancy until you deliver your baby.  Try taking medicine that helps you poop (stool softener) as needed, and if your doctor approves. Eat more fiber by eating fresh fruit, vegetables, and whole grains. Drink enough fluids to keep your pee (urine) clear or pale yellow.  Take warm water baths (sitz baths) to soothe pain or discomfort caused by hemorrhoids. Use hemorrhoid cream if your doctor approves.  If you have puffy, bulging veins (varicose veins), wear support hose. Raise (elevate) your feet for 15 minutes, 3-4 times a day. Limit salt in your diet.  Avoid heavy lifting, wear low heals, and sit up straight.  Rest with your legs raised if you have leg cramps or low back pain.  Visit your dentist if you have not gone during your pregnancy. Use a soft toothbrush to brush your teeth. Be gentle when you floss.  You can have sex (intercourse) unless your doctor tells you not to.  Go to your doctor visits. Get help if:  You feel dizzy.  You have mild cramps or pressure in your lower belly (abdomen).  You have a nagging pain in your belly area.  You continue to feel sick to your stomach (nauseous), throw up (vomit), or have watery poop (diarrhea).  You have bad smelling fluid coming from your vagina.  You have pain with peeing (urination). Get help right away if:  You have a fever.  You are leaking fluid from your vagina.  You have spotting or  bleeding from your vagina.  You have severe belly cramping or pain.  You lose or gain weight rapidly.  You have trouble catching your breath and have chest pain.  You notice sudden or extreme puffiness (swelling) of your face, hands, ankles, feet, or legs.  You have not felt the baby move in over an hour.  You have severe headaches that do not go away with medicine.  You have vision changes. This information is not intended to replace advice given to you by your health care provider. Make sure you discuss any questions you have with your health care provider. Document Released: 01/03/2010 Document Revised: 03/16/2016 Document Reviewed: 12/10/2012 Elsevier Interactive Patient Education  2017 [employer].

## 2018-08-30 NOTE — Progress Notes (Signed)
.  Patient presents today for NOB PE.  Patient request to have first trimester genetic testing.

## 2018-08-31 LAB — CBC
Hematocrit: 33.6 % — ABNORMAL LOW (ref 34.0–46.6)
Hemoglobin: 11.4 g/dL (ref 11.1–15.9)
MCH: 29 pg (ref 26.6–33.0)
MCHC: 33.9 g/dL (ref 31.5–35.7)
MCV: 86 fL (ref 79–97)
PLATELETS: 208 10*3/uL (ref 150–450)
RBC: 3.93 x10E6/uL (ref 3.77–5.28)
RDW: 12.1 % — ABNORMAL LOW (ref 12.3–15.4)
WBC: 6.9 10*3/uL (ref 3.4–10.8)

## 2018-09-06 ENCOUNTER — Telehealth: Payer: Self-pay | Admitting: Certified Nurse Midwife

## 2018-09-06 NOTE — Telephone Encounter (Signed)
LMTRC

## 2018-09-06 NOTE — Telephone Encounter (Signed)
Patient returned call, advised Stacey Harrison results are still pending.  Patient verbalized understanding.

## 2018-09-06 NOTE — Telephone Encounter (Signed)
The patient is asking if her Stacey Harrison test results are available?  Please advise, thanks.

## 2018-09-09 ENCOUNTER — Telehealth: Payer: Self-pay | Admitting: Certified Nurse Midwife

## 2018-09-09 ENCOUNTER — Encounter (HOSPITAL_COMMUNITY): Payer: Self-pay

## 2018-09-09 NOTE — Telephone Encounter (Signed)
Spoke with Adriana Reams [facility] Sister) and gave her the gender which was verbally requested at [facility] PE OV by Cleda Clarks.

## 2018-09-09 NOTE — Telephone Encounter (Signed)
This patient's "friend" called and stated the patient asked her to get the gender for a gender reveal.  I did inform Ms. Newton that I was not sure if we were allowed to give that info out over the phone due to [place] regulations, and she became upset stating the patient did call and give permission.  I then tried to connect Ms. Newton to the patient's nurse, and was unable to do so at this time, and a message is being sent back to inform staff of the situation, and Ms. Ernestina Patches can be reached at [phone] which is a cell with voicemail, please advise, thanks.

## 2018-09-13 ENCOUNTER — Encounter: Payer: Self-pay | Admitting: Certified Nurse Midwife

## 2018-09-16 ENCOUNTER — Encounter: Payer: Self-pay | Admitting: Certified Nurse Midwife

## 2018-09-17 ENCOUNTER — Encounter: Payer: Self-pay | Admitting: Certified Nurse Midwife

## 2018-09-27 ENCOUNTER — Ambulatory Visit (INDEPENDENT_AMBULATORY_CARE_PROVIDER_SITE_OTHER): Payer: 59 | Admitting: Certified Nurse Midwife

## 2018-09-27 VITALS — BP 114/74 | HR 91 | Wt 150.3 lb

## 2018-09-27 DIAGNOSIS — Z3492 Encounter for supervision of normal pregnancy, unspecified, second trimester: Secondary | ICD-10-CM

## 2018-09-27 LAB — POCT URINALYSIS DIPSTICK OB
Bilirubin, UA: NEGATIVE
Blood, UA: NEGATIVE
Glucose, UA: NEGATIVE
Ketones, UA: NEGATIVE
Leukocytes, UA: NEGATIVE
Nitrite, UA: NEGATIVE
POC,PROTEIN,UA: NEGATIVE
Spec Grav, UA: 1.01 (ref 1.010–1.025)
Urobilinogen, UA: 0.2 E.U./dL
pH, UA: 8 (ref 5.0–8.0)

## 2018-09-27 NOTE — Progress Notes (Signed)
ROB-Reports intermittent congestion and cough. Discussed home treatment measures. Having a girl, naming her "Stacey Harrison". Anticipatory guidance regarding course of prenatal care. Reviewed red flag symptoms and when to call. RTC x 4 weeks for ANATOMY SCAN and ROB or sooner if needed.

## 2018-09-27 NOTE — Patient Instructions (Signed)
Second Trimester of Pregnancy The second trimester is from week 13 through week 28, month 4 through 6. This is often the time in pregnancy that you feel your best. Often times, morning sickness has lessened or quit. You may have more energy, and you may get hungry more often. Your unborn baby (fetus) is growing rapidly. At the end of the sixth month, he or she is about 9 inches long and weighs about 1 pounds. You will likely feel the baby move (quickening) between 18 and 20 weeks of pregnancy. Follow these instructions at home:  Avoid all smoking, herbs, and alcohol. Avoid drugs not approved by your doctor.  Do not use any tobacco products, including cigarettes, chewing tobacco, and electronic cigarettes. If you need help quitting, ask your doctor. You may get counseling or other support to help you quit.  Only take medicine as told by your doctor. Some medicines are safe and some are not during pregnancy.  Exercise only as told by your doctor. Stop exercising if you start having cramps.  Eat regular, healthy meals.  Wear a good support bra if your breasts are tender.  Do not use hot tubs, steam rooms, or saunas.  Wear your seat belt when driving.  Avoid raw meat, uncooked cheese, and liter boxes and soil used by cats.  Take your prenatal vitamins.  Take 1500-2000 milligrams of calcium daily starting at the 20th week of pregnancy until you deliver your baby.  Try taking medicine that helps you poop (stool softener) as needed, and if your doctor approves. Eat more fiber by eating fresh fruit, vegetables, and whole grains. Drink enough fluids to keep your pee (urine) clear or pale yellow.  Take warm water baths (sitz baths) to soothe pain or discomfort caused by hemorrhoids. Use hemorrhoid cream if your doctor approves.  If you have puffy, bulging veins (varicose veins), wear support hose. Raise (elevate) your feet for 15 minutes, 3-4 times a day. Limit salt in your diet.  Avoid heavy  lifting, wear low heals, and sit up straight.  Rest with your legs raised if you have leg cramps or low back pain.  Visit your dentist if you have not gone during your pregnancy. Use a soft toothbrush to brush your teeth. Be gentle when you floss.  You can have sex (intercourse) unless your doctor tells you not to.  Go to your doctor visits. Get help if:  You feel dizzy.  You have mild cramps or pressure in your lower belly (abdomen).  You have a nagging pain in your belly area.  You continue to feel sick to your stomach (nauseous), throw up (vomit), or have watery poop (diarrhea).  You have bad smelling fluid coming from your vagina.  You have pain with peeing (urination). Get help right away if:  You have a fever.  You are leaking fluid from your vagina.  You have spotting or bleeding from your vagina.  You have severe belly cramping or pain.  You lose or gain weight rapidly.  You have trouble catching your breath and have chest pain.  You notice sudden or extreme puffiness (swelling) of your face, hands, ankles, feet, or legs.  You have not felt the baby move in over an hour.  You have severe headaches that do not go away with medicine.  You have vision changes. This information is not intended to replace advice given to you by your health care provider. Make sure you discuss any questions you have with your health care   provider. Document Released: 01/03/2010 Document Revised: 03/16/2016 Document Reviewed: 12/10/2012 Elsevier Interactive Patient Education  2017 [employer]. Back Pain in Pregnancy Back pain during pregnancy is common. Back pain may be caused by several factors that are related to changes during your pregnancy. Follow these instructions at home: Managing pain, stiffness, and swelling  If directed, apply ice for sudden (acute) back pain. ? Put ice in a plastic bag. ? Place a towel between your skin and the bag. ? Leave the ice on for 20  minutes, 2-3 times per day.  If directed, apply heat to the affected area before you exercise: ? Place a towel between your skin and the heat pack or heating pad. ? Leave the heat on for 20-30 minutes. ? Remove the heat if your skin turns bright red. This is especially important if you are unable to feel pain, heat, or cold. You may have a greater risk of getting burned. Activity  Exercise as told by your health care provider. Exercising is the best way to prevent or manage back pain.  Listen to your body when lifting. If lifting hurts, ask for help or bend your knees. This uses your leg muscles instead of your back muscles.  Squat down when picking up something from the floor. Do not bend over.  Only use bed rest as told by your health care provider. Bed rest should only be used for the most severe episodes of back pain. Standing, Sitting, and Lying Down  Do not stand in one place for long periods of time.  Use good posture when sitting. Make sure your head rests over your shoulders and is not hanging forward. Use a pillow on your lower back if necessary.  Try sleeping on your side, preferably the left side, with a pillow or two between your legs. If you are sore after a night's rest, your bed may be too soft. A firm mattress may provide more support for your back during pregnancy. General instructions  Do not wear high heels.  Eat a healthy diet. Try to gain weight within your health care provider's recommendations.  Use a maternity girdle, elastic sling, or back brace as told by your health care provider.  Take over-the-counter and prescription medicines only as told by your health care provider.  Keep all follow-up visits as told by your health care provider. This is important. This includes any visits with any specialists, such as a physical therapist. Contact a health care provider if:  Your back pain interferes with your daily activities.  You have increasing pain in other  parts of your body. Get help right away if:  You develop numbness, tingling, weakness, or problems with the use of your arms or legs.  You develop severe back pain that is not controlled with medicine.  You have a sudden change in bowel or bladder control.  You develop shortness of breath, dizziness, or you faint.  You develop nausea, vomiting, or sweating.  You have back pain that is a rhythmic, cramping pain similar to labor pains. Labor pain is usually 1-2 minutes apart, lasts for about 1 minute, and involves a bearing down feeling or pressure in your pelvis.  You have back pain and your water breaks or you have vaginal bleeding.  You have back pain or numbness that travels down your leg.  Your back pain developed after you fell.  You develop pain on one side of your back.  You see blood in your urine.  You develop skin  blisters in the area of your back pain. This information is not intended to replace advice given to you by your health care provider. Make sure you discuss any questions you have with your health care provider. Document Released: 01/17/2006 Document Revised: 03/16/2016 Document Reviewed: 06/23/2015 Elsevier Interactive Patient Education  2018 [employer]. Round Ligament Pain The round ligament is a cord of muscle and tissue that helps to support the uterus. It can become a source of pain during pregnancy if it becomes stretched or twisted as the baby grows. The pain usually begins in the second trimester of pregnancy, and it can come and go until the baby is delivered. It is not a serious problem, and it does not cause harm to the baby. Round ligament pain is usually a short, sharp, and pinching pain, but it can also be a dull, lingering, and aching pain. The pain is felt in the lower side of the abdomen or in the groin. It usually starts deep in the groin and moves up to the outside of the hip area. Pain can occur with:  A sudden change in position.  Rolling  over in bed.  Coughing or sneezing.  Physical activity.  Follow these instructions at home: Watch your condition for any changes. Take these steps to help with your pain:  When the pain starts, relax. Then try: ? Sitting down. ? Flexing your knees up to your abdomen. ? Lying on your side with one pillow under your abdomen and another pillow between your legs. ? Sitting in a warm bath for 15-20 minutes or until the pain goes away.  Take over-the-counter and prescription medicines only as told by your health care provider.  Move slowly when you sit and stand.  Avoid long walks if they cause pain.  Stop or lessen your physical activities if they cause pain.  Contact a health care provider if:  Your pain does not go away with treatment.  You feel pain in your back that you did not have before.  Your medicine is not helping. Get help right away if:  You develop a fever or chills.  You develop uterine contractions.  You develop vaginal bleeding.  You develop nausea or vomiting.  You develop diarrhea.  You have pain when you urinate. This information is not intended to replace advice given to you by your health care provider. Make sure you discuss any questions you have with your health care provider. Document Released: 07/18/2008 Document Revised: 03/16/2016 Document Reviewed: 12/16/2014 Elsevier Interactive Patient Education  [employer].
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
## 2018-10-01 ENCOUNTER — Encounter: Payer: Self-pay | Admitting: Certified Nurse Midwife

## 2018-10-23 NOTE — L&D Delivery Note (Signed)
Delivery Note At  0109am a viable and healthy female "Stacey Harrison" was delivered via  (Presentation:LOA ;  ).  APGAR: 8, 9; weight pending  .   Placenta status: delivered intact with 3 vessel  Cord:  with the following complications: Orchid x1 easily reduced on perineum  Anesthesia:  epidural Episiotomy:  none Lacerations:  1st Suture Repair: 3.0 vicryl rapide Est. Blood Loss (mL):  200  Mom to postpartum.  Baby to Couplet care / Skin to Skin.  Andrew Blasius N Abdullah Rizzi 03/08/2019, 1:27 AM

## 2018-10-24 ENCOUNTER — Other Ambulatory Visit: Payer: Self-pay | Admitting: Certified Nurse Midwife

## 2018-10-24 DIAGNOSIS — Z3482 Encounter for supervision of other normal pregnancy, second trimester: Secondary | ICD-10-CM

## 2018-10-25 ENCOUNTER — Telehealth: Payer: Self-pay | Admitting: Certified Nurse Midwife

## 2018-10-25 ENCOUNTER — Ambulatory Visit (INDEPENDENT_AMBULATORY_CARE_PROVIDER_SITE_OTHER): Payer: 59 | Admitting: Certified Nurse Midwife

## 2018-10-25 ENCOUNTER — Telehealth: Payer: Self-pay

## 2018-10-25 ENCOUNTER — Ambulatory Visit (INDEPENDENT_AMBULATORY_CARE_PROVIDER_SITE_OTHER): Payer: 59

## 2018-10-25 VITALS — BP 107/62 | HR 109 | Wt 152.2 lb

## 2018-10-25 DIAGNOSIS — Z363 Encounter for antenatal screening for malformations: Secondary | ICD-10-CM | POA: Diagnosis not present

## 2018-10-25 DIAGNOSIS — Z3492 Encounter for supervision of normal pregnancy, unspecified, second trimester: Secondary | ICD-10-CM | POA: Diagnosis not present

## 2018-10-25 DIAGNOSIS — Z3482 Encounter for supervision of other normal pregnancy, second trimester: Secondary | ICD-10-CM

## 2018-10-25 LAB — POCT URINALYSIS DIPSTICK OB
Bilirubin, UA: NEGATIVE
Blood, UA: NEGATIVE
Glucose, UA: NEGATIVE
Ketones, UA: NEGATIVE
Leukocytes, UA: NEGATIVE
Nitrite, UA: NEGATIVE
POC,PROTEIN,UA: NEGATIVE
Spec Grav, UA: 1.025 (ref 1.010–1.025)
UROBILINOGEN UA: 0.2 U/dL
pH, UA: 6 (ref 5.0–8.0)

## 2018-10-25 NOTE — Telephone Encounter (Signed)
The patient called and stated that her insurance company notified her that her medical records have not been submitted/sent to [place] from our office. I informed the patient that the message will be routed to the representative that handles MRR/ and a nurse for documentation. The patient is requesting a f/u call back. Please advise.

## 2018-10-25 NOTE — Telephone Encounter (Signed)
Spoke with pt- [employer] reps name and number given to pt to call and find out what the problem is. Pt will get back to [place] if needed.

## 2018-10-25 NOTE — Progress Notes (Signed)
ROB doing well. No complaints or questions at this time. Anatomy u/s reviewed. Scan incomplete, pt to return 1-2 wks for completion. Follow up ROB 4 wks with melody.   Philip Aspen, CNM   Patient Name: Stacey Harrison DOB: 1996-06-02 MRN: [id number]  ULTRASOUND REPORT  Location:[facility] Date of Service: 10/25/2018   Indications:Anatomy Ultrasound Findings:  [name] intrauterine pregnancy is visualized with FHR at157  BPM. Biometrics give an (U/S) Gestational age [redacted]w[redacted]d and an (U/S) EDD of 03/13/19; this correlates with the clinically established Estimated Date of Delivery: 03/09/19  Fetal presentation is Cephalic.  EFW: wnl. Placenta: posterior. Grade: 0 AFI: subjectively normal.  Anatomic survey is incomplete for nose and lips and face d/t fetal position and normal; Gender - female.    Right Ovary is normal in appearance. Left Ovary is normal appearance. Survey of the adnexa demonstrates no adnexal masses. There is no free peritoneal fluid in the cul de sac.  Impression: 1. [redacted]w[redacted]d Viable Singleton Intrauterine pregnancy by U/S. 2. (U/S) EDD is consistent with Clinically established Estimated Date of Delivery: 03/09/19 . 3. Normal Anatomy Scan, incomplete for nose and lips and face d/t fetal position  Recommendations: 1.Clinical correlation with the patient's History and Physical Exam. 2.   Ninfa Linden ,RDMS

## 2018-11-07 ENCOUNTER — Telehealth: Payer: Self-pay

## 2018-11-07 ENCOUNTER — Ambulatory Visit (INDEPENDENT_AMBULATORY_CARE_PROVIDER_SITE_OTHER): Payer: 59

## 2018-11-07 DIAGNOSIS — Z362 Encounter for other antenatal screening follow-up: Secondary | ICD-10-CM

## 2018-11-07 DIAGNOSIS — Z3492 Encounter for supervision of normal pregnancy, unspecified, second trimester: Secondary | ICD-10-CM
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
## 2018-11-22 ENCOUNTER — Encounter: Payer: 59 | Admitting: Obstetrics and Gynecology

## 2018-11-22 ENCOUNTER — Ambulatory Visit (INDEPENDENT_AMBULATORY_CARE_PROVIDER_SITE_OTHER): Payer: 59 | Admitting: Obstetrics and Gynecology

## 2018-11-22 VITALS — BP 112/69 | HR 89 | Wt 157.7 lb

## 2018-11-22 DIAGNOSIS — Z3493 Encounter for supervision of normal pregnancy, unspecified, third trimester: Secondary | ICD-10-CM

## 2018-11-22 LAB — POCT URINALYSIS DIPSTICK OB
BILIRUBIN UA: NEGATIVE
Blood, UA: NEGATIVE
Glucose, UA: NEGATIVE
Ketones, UA: NEGATIVE
Leukocytes, UA: NEGATIVE
Nitrite, UA: NEGATIVE
POC,PROTEIN,UA: NEGATIVE
Spec Grav, UA: 1.01 (ref 1.010–1.025)
Urobilinogen, UA: 0.2 E.U./dL
pH, UA: 6 (ref 5.0–8.0)

## 2018-11-22 NOTE — Progress Notes (Signed)
ROB - doing well, discussed classes and feeding preferences.

## 2018-11-22 NOTE — Progress Notes (Signed)
ROB- pt is doing well 

## 2018-12-02 ENCOUNTER — Encounter: Payer: Self-pay | Admitting: Certified Nurse Midwife

## 2018-12-05 ENCOUNTER — Telehealth: Payer: Self-pay | Admitting: Certified Nurse Midwife

## 2018-12-05 ENCOUNTER — Telehealth: Payer: Self-pay

## 2018-12-05 NOTE — Telephone Encounter (Signed)
Message accidentally sent to nurse that is not here. Please advise message below.

## 2018-12-05 NOTE — Telephone Encounter (Signed)
The patient called and stated that she was sent home due to elevated blood pressure due to stress. The patient stated that the last BP was 122/90. The patient is requesting a call back from a nurse as soon as possible. Please advise.

## 2018-12-05 NOTE — Telephone Encounter (Signed)
Patient called the office to report she had a high BP reading today. 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 She had her BP taken due to the stress and she was eventually sent home. She states she just took her BP at home and it was 132/80. She denies any facial swelling,headache,blurred vision or spotting/cramping. Encouraged pt to relax, take a nap, read a book but stay hydrated. Also requested she take her BP again in a few hours and send a [employer]. 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

## 2018-12-06 NOTE — Telephone Encounter (Signed)
Per SS, she spoke with patient.

## 2018-12-17 ENCOUNTER — Encounter: Payer: Self-pay | Admitting: Certified Nurse Midwife

## 2018-12-19 ENCOUNTER — Other Ambulatory Visit: Payer: Self-pay | Admitting: *Deleted

## 2018-12-19 DIAGNOSIS — Z130 Encounter for screening for diseases of the blood and blood-forming organs and certain disorders involving the immune mechanism: Secondary | ICD-10-CM

## 2018-12-19 DIAGNOSIS — Z3493 Encounter for supervision of normal pregnancy, unspecified, third trimester: Secondary | ICD-10-CM

## 2018-12-19 DIAGNOSIS — Z131 Encounter for screening for diabetes mellitus: Secondary | ICD-10-CM

## 2018-12-20 ENCOUNTER — Encounter: Payer: 59 | Admitting: Certified Nurse Midwife

## 2018-12-20 ENCOUNTER — Ambulatory Visit (INDEPENDENT_AMBULATORY_CARE_PROVIDER_SITE_OTHER): Payer: 59 | Admitting: Certified Nurse Midwife

## 2018-12-20 ENCOUNTER — Other Ambulatory Visit: Payer: 59

## 2018-12-20 ENCOUNTER — Encounter: Payer: Self-pay | Admitting: Certified Nurse Midwife

## 2018-12-20 VITALS — BP 110/63 | HR 96 | Wt 163.2 lb

## 2018-12-20 DIAGNOSIS — Z23 Encounter for immunization: Secondary | ICD-10-CM

## 2018-12-20 DIAGNOSIS — Z131 Encounter for screening for diabetes mellitus: Secondary | ICD-10-CM | POA: Diagnosis not present

## 2018-12-20 DIAGNOSIS — Z3493 Encounter for supervision of normal pregnancy, unspecified, third trimester: Secondary | ICD-10-CM

## 2018-12-20 DIAGNOSIS — Z130 Encounter for screening for diseases of the blood and blood-forming organs and certain disorders involving the immune mechanism: Secondary | ICD-10-CM

## 2018-12-20 LAB — POCT URINALYSIS DIPSTICK OB
Bilirubin, UA: NEGATIVE
Blood, UA: NEGATIVE
Glucose, UA: NEGATIVE
Ketones, UA: NEGATIVE
Leukocytes, UA: NEGATIVE
Nitrite, UA: NEGATIVE
PH UA: 6 (ref 5.0–8.0)
PROTEIN: NEGATIVE
Spec Grav, UA: 1.01 (ref 1.010–1.025)
Urobilinogen, UA: 0.2 E.U./dL

## 2018-12-20 MED ORDER — TETANUS-DIPHTH-ACELL PERTUSSIS 5-2.5-18.5 LF-MCG/0.5 IM SUSP
0.5000 mL | Freq: Once | INTRAMUSCULAR | Status: AC
  Administered 2018-12-20: 0.5 mL via INTRAMUSCULAR
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
## 2018-12-20 NOTE — Patient Instructions (Signed)
Glucose Tolerance Test During Pregnancy Why am I having this test? The glucose tolerance test (GTT) is done to check how your body processes sugar (glucose). This is one of several tests used to diagnose diabetes that develops during pregnancy (gestational diabetes mellitus). Gestational diabetes is a temporary form of diabetes that some women develop during pregnancy. It usually occurs during the second trimester of pregnancy and goes away after delivery. Testing (screening) for gestational diabetes usually occurs between 24 and 28 weeks of pregnancy. You may have the GTT test after having a 1-hour glucose screening test if the results from that test indicate that you may have gestational diabetes. You may also have this test if:  You have a history of gestational diabetes.  You have a history of giving birth to very large babies or have experienced repeated fetal loss (stillbirth).  You have signs and symptoms of diabetes, such as: ? Changes in your vision. ? Tingling or numbness in your hands or feet. ? Changes in hunger, thirst, and urination that are not otherwise explained by your pregnancy. What is being tested? This test measures the amount of glucose in your blood at different times during a period of 3 hours. This indicates how well your body is able to process glucose. What kind of sample is taken?  Blood samples are required for this test. They are usually collected by inserting a needle into a blood vessel. How do I prepare for this test?  For 3 days before your test, eat normally. Have plenty of carbohydrate-rich foods.  Follow instructions from your health care provider about: ? Eating or drinking restrictions on the day of the test. You may be asked to not eat or drink anything other than water (fast) starting 8-10 hours before the test. ? Changing or stopping your regular medicines. Some medicines may interfere with this test. Tell a health care provider about:  All  medicines you are taking, including vitamins, herbs, eye drops, creams, and over-the-counter medicines.  Any blood disorders you have.  Any surgeries you have had.  Any medical conditions you have. What happens during the test? First, your blood glucose will be measured. This is referred to as your fasting blood glucose, since you fasted before the test. Then, you will drink a glucose solution that contains a certain amount of glucose. Your blood glucose will be measured again 1, 2, and 3 hours after drinking the solution. This test takes about 3 hours to complete. You will need to stay at the testing location during this time. During the testing period:  Do not eat or drink anything other than the glucose solution.  Do not exercise.  Do not use any products that contain nicotine or tobacco, such as cigarettes and e-cigarettes. If you need help stopping, ask your health care provider. The testing procedure may vary among health care providers and hospitals. How are the results reported? Your results will be reported as milligrams of glucose per deciliter of blood (mg/dL) or millimoles per liter (mmol/L). Your health care provider will compare your results to normal ranges that were established after testing a large group of people (reference ranges). Reference ranges may vary among labs and hospitals. For this test, common reference ranges are:  Fasting: less than 95-105 mg/dL (5.3-5.8 mmol/L).  1 hour after drinking glucose: less than 180-190 mg/dL (10.0-10.5 mmol/L).  2 hours after drinking glucose: less than 155-165 mg/dL (8.6-9.2 mmol/L).  3 hours after drinking glucose: 140-145 mg/dL (7.8-8.1 mmol/L). What do the   results mean? Results within reference ranges are considered normal, meaning that your glucose levels are well-controlled. If two or more of your blood glucose levels are high, you may be diagnosed with gestational diabetes. If only one level is high, your health care  provider may suggest repeat testing or other tests to confirm a diagnosis. Talk with your health care provider about what your results mean. Questions to ask your health care provider Ask your health care provider, or the department that is doing the test:  When will my results be ready?  How will I get my results?  What are my treatment options?  What other tests do I need?  What are my next steps? Summary  The glucose tolerance test (GTT) is one of several tests used to diagnose diabetes that develops during pregnancy (gestational diabetes mellitus). Gestational diabetes is a temporary form of diabetes that some women develop during pregnancy.  You may have the GTT test after having a 1-hour glucose screening test if the results from that test indicate that you may have gestational diabetes. You may also have this test if you have any symptoms or risk factors for gestational diabetes.  Talk with your health care provider about what your results mean. This information is not intended to replace advice given to you by your health care provider. Make sure you discuss any questions you have with your health care provider. Document Released: 04/09/2012 Document Revised: 05/21/2017 Document Reviewed: 05/21/2017 Elsevier Interactive Patient Education  2019 [employer]  

## 2018-12-21 LAB — CBC
Hematocrit: 29 % — ABNORMAL LOW (ref 34.0–46.6)
Hemoglobin: 9.6 g/dL — ABNORMAL LOW (ref 11.1–15.9)
MCH: 29 pg (ref 26.6–33.0)
MCHC: 33.1 g/dL (ref 31.5–35.7)
MCV: 88 fL (ref 79–97)
Platelets: 182 10*3/uL (ref 150–450)
RBC: 3.31 x10E6/uL — ABNORMAL LOW (ref 3.77–5.28)
RDW: 12.6 % (ref 11.7–15.4)
WBC: 8.7 10*3/uL (ref 3.4–10.8)

## 2018-12-21 LAB — RPR: RPR Ser Ql: NONREACTIVE

## 2018-12-21 LAB — GLUCOSE, 1 HOUR GESTATIONAL: GESTATIONAL DIABETES SCREEN: 137 mg/dL (ref 65–139)

## 2018-12-23 ENCOUNTER — Other Ambulatory Visit: Payer: Self-pay | Admitting: Certified Nurse Midwife

## 2018-12-23 MED ORDER — FUSION PLUS PO CAPS: 1.0000 | ORAL_CAPSULE | Freq: Every day | ORAL | 9 refills | Status: DC

## 2019-01-03 ENCOUNTER — Encounter: Payer: 59 | Admitting: Obstetrics and Gynecology

## 2019-01-03 ENCOUNTER — Other Ambulatory Visit: Payer: Self-pay

## 2019-01-03 ENCOUNTER — Ambulatory Visit (INDEPENDENT_AMBULATORY_CARE_PROVIDER_SITE_OTHER): Payer: 59 | Admitting: Certified Nurse Midwife

## 2019-01-03 VITALS — BP 109/80 | HR 90 | Wt 164.0 lb

## 2019-01-03 DIAGNOSIS — O99013 Anemia complicating pregnancy, third trimester: Secondary | ICD-10-CM

## 2019-01-03 DIAGNOSIS — Z3493 Encounter for supervision of normal pregnancy, unspecified, third trimester: Secondary | ICD-10-CM

## 2019-01-03 LAB — POCT URINALYSIS DIPSTICK OB
Bilirubin, UA: NEGATIVE
Blood, UA: NEGATIVE
Glucose, UA: NEGATIVE
Ketones, UA: NEGATIVE
Leukocytes, UA: NEGATIVE
Nitrite, UA: NEGATIVE
POC,PROTEIN,UA: NEGATIVE
Spec Grav, UA: >=1.03 — AB (ref 1.010–1.025)
Urobilinogen, UA: 0.2 E.U./dL
pH, UA: 7 (ref 5.0–8.0)

## 2019-01-03 NOTE — Patient Instructions (Signed)
WE WOULD LOVE TO HEAR FROM YOU!!!!   Thank you Stacey Harrison for visiting [facility].  Providing our patients with the best experience possible is really important to [place], and we hope that you felt that on your recent visit. The most valuable feedback we get comes from [place]!!    If you receive a survey please take a couple of minutes to let us know how we did.Thank you for continuing to trust [place] with your care.   [facility]   Third Trimester of Pregnancy  The third trimester is from week 28 through week 40 (months 7 through 9). This trimester is when your unborn baby (fetus) is growing very fast. At the end of the ninth month, the unborn baby is about 20 inches in length. It weighs about 6-10 pounds. Follow these instructions at home: Medicines  Take over-the-counter and prescription medicines only as told by your doctor. Some medicines are safe and some medicines are not safe during pregnancy.  Take a prenatal vitamin that contains at least 600 micrograms (mcg) of folic acid.  If you have trouble pooping (constipation), take medicine that will make your stool soft (stool softener) if your doctor approves. Eating and drinking   Eat regular, healthy meals.  Avoid raw meat and uncooked cheese.  If you get low calcium from the food you eat, talk to your doctor about taking a daily calcium supplement.  Eat four or five small meals rather than three large meals a day.  Avoid foods that are high in fat and sugars, such as fried and sweet foods.  To prevent constipation: ? Eat foods that are high in fiber, like fresh fruits and vegetables, whole grains, and beans. ? Drink enough fluids to keep your pee (urine) clear or pale yellow. Activity  Exercise only as told by your doctor. Stop exercising if you start to have cramps.  Avoid heavy lifting, wear low heels, and sit up straight.  Do not exercise if it is too hot, too humid, or if you  are in a place of great height (high altitude).  You may continue to have sex unless your doctor tells you not to. Relieving pain and discomfort  Wear a good support bra if your breasts are tender.  Take frequent breaks and rest with your legs raised if you have leg cramps or low back pain.  Take warm water baths (sitz baths) to soothe pain or discomfort caused by hemorrhoids. Use hemorrhoid cream if your doctor approves.  If you develop puffy, bulging veins (varicose veins) in your legs: ? Wear support hose or compression stockings as told by your doctor. ? Raise (elevate) your feet for 15 minutes, 3-4 times a day. ? Limit salt in your food. Safety  Wear your seat belt when driving.  Make a list of emergency phone numbers, including numbers for family, friends, the hospital, and police and fire departments. Preparing for your baby's arrival To prepare for the arrival of your baby:  Take prenatal classes.  Practice driving to the hospital.  Visit the hospital and tour the maternity area.  Talk to your work about taking leave once the baby comes.  Pack your hospital bag.  Prepare the baby's room.  Go to your doctor visits.  Buy a rear-facing car seat. Learn how to install it in your car. General instructions  Do not use hot tubs, steam rooms, or saunas.  Do not use any products that contain nicotine or tobacco, such as  cigarettes and e-cigarettes. If you need help quitting, ask your doctor.  Do not drink alcohol.  Do not douche or use tampons or scented sanitary pads.  Do not cross your legs for long periods of time.  Do not travel for long distances unless you must. Only do so if your doctor says it is okay.  Visit your dentist if you have not gone during your pregnancy. Use a soft toothbrush to brush your teeth. Be gentle when you floss.  Avoid cat litter boxes and soil used by cats. These carry germs that can cause birth defects in the baby and can cause a loss  of your baby (miscarriage) or stillbirth.  Keep all your prenatal visits as told by your doctor. This is important. Contact a doctor if:  You are not sure if you are in labor or if your water has broken.  You are dizzy.  You have mild cramps or pressure in your lower belly.  You have a nagging pain in your belly area.  You continue to feel sick to your stomach, you throw up, or you have watery poop.  You have bad smelling fluid coming from your vagina.  You have pain when you pee. Get help right away if:  You have a fever.  You are leaking fluid from your vagina.  You are spotting or bleeding from your vagina.  You have severe belly cramps or pain.  You lose or gain weight quickly.  You have trouble catching your breath and have chest pain.  You notice sudden or extreme puffiness (swelling) of your face, hands, ankles, feet, or legs.  You have not felt the baby move in over an hour.  You have severe headaches that do not go away with medicine.  You have trouble seeing.  You are leaking, or you are having a gush of fluid, from your vagina before you are 37 weeks.  You have regular belly spasms (contractions) before you are 37 weeks. Summary  The third trimester is from week 28 through week 40 (months 7 through 9). This time is when your unborn baby is growing very fast.  Follow your doctor's advice about medicine, food, and activity.  Get ready for the arrival of your baby by taking prenatal classes, getting all the baby items ready, preparing the baby's room, and visiting your doctor to be checked.  Get help right away if you are bleeding from your vagina, or you have chest pain and trouble catching your breath, or if you have not felt your baby move in over an hour. This information is not intended to replace advice given to you by your health care provider. Make sure you discuss any questions you have with your health care provider. Document Released: 01/03/2010  Document Revised: 11/14/2016 Document Reviewed: 11/14/2016 Elsevier Interactive Patient Education  2019 [employer].

## 2019-01-06 ENCOUNTER — Encounter: Payer: Self-pay | Admitting: Certified Nurse Midwife

## 2019-01-06 NOTE — Progress Notes (Signed)
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 Sent home twice for elevated blood pressures. Occasional nosebleeds and common discomforts of pregnancy; discussed home treatment measures. Samples of Fusion Plus given. Scheduled for [facility] in May. Anticipatory guidance regarding course of prenatal care. Reviewed red flag symptoms and when to call. RTC x 2 weeks for ROB or sooner if needed.

## 2019-01-07 ENCOUNTER — Telehealth: Payer: Self-pay | Admitting: Certified Nurse Midwife

## 2019-01-07 NOTE — Telephone Encounter (Signed)
The patient called back and stated that Sharyn Lull could respond to her via MyChart if that is easier for her, please advise, thanks.

## 2019-01-07 NOTE — Telephone Encounter (Signed)
The patient called and stated that she would like a call back from Stacey Harrison is possible today. Please advise.

## 2019-01-09 ENCOUNTER — Encounter: Payer: Self-pay | Admitting: Certified Nurse Midwife

## 2019-01-14 ENCOUNTER — Telehealth: Payer: Self-pay | Admitting: Certified Nurse Midwife

## 2019-01-14 NOTE — Telephone Encounter (Signed)
Patient called requesting a not for working from home on 3/17 due to health concerns surrounding COVID 19. She has not received a response from [employer] and has sent [employer] as well as telephone messages. She would like a response today.Thanks

## 2019-01-20 ENCOUNTER — Encounter: Payer: Self-pay | Admitting: Certified Nurse Midwife

## 2019-01-20 ENCOUNTER — Telehealth: Payer: Self-pay | Admitting: Certified Nurse Midwife

## 2019-01-20 NOTE — Telephone Encounter (Signed)
The patient called and stated that she would like to speak with a nurse in regards to her FMLA paperwork. Please advise.

## 2019-01-24 ENCOUNTER — Encounter: Payer: 59 | Admitting: Certified Nurse Midwife

## 2019-01-27 ENCOUNTER — Encounter: Payer: Self-pay | Admitting: Certified Nurse Midwife

## 2019-01-27 ENCOUNTER — Other Ambulatory Visit: Payer: Self-pay

## 2019-01-31 ENCOUNTER — Encounter: Payer: 59 | Admitting: Certified Nurse Midwife

## 2019-02-06 ENCOUNTER — Encounter: Payer: Self-pay | Admitting: Certified Nurse Midwife

## 2019-02-07 ENCOUNTER — Encounter: Payer: Self-pay | Admitting: *Deleted

## 2019-02-10 ENCOUNTER — Other Ambulatory Visit: Payer: Self-pay

## 2019-02-10 ENCOUNTER — Encounter: Payer: Self-pay | Admitting: Certified Nurse Midwife

## 2019-02-10 ENCOUNTER — Other Ambulatory Visit (HOSPITAL_COMMUNITY)
Admission: RE | Admit: 2019-02-10 | Discharge: 2019-02-10 | Disposition: A | Discharge status: Home / Self Care | Payer: 59 | Source: Ambulatory Visit | Attending: Certified Nurse Midwife | Admitting: Certified Nurse Midwife

## 2019-02-10 ENCOUNTER — Ambulatory Visit (INDEPENDENT_AMBULATORY_CARE_PROVIDER_SITE_OTHER): Payer: 59 | Admitting: Certified Nurse Midwife

## 2019-02-10 VITALS — BP 105/65 | HR 98 | Wt 178.0 lb

## 2019-02-10 DIAGNOSIS — O99013 Anemia complicating pregnancy, third trimester: Secondary | ICD-10-CM

## 2019-02-10 DIAGNOSIS — Z3493 Encounter for supervision of normal pregnancy, unspecified, third trimester: Secondary | ICD-10-CM

## 2019-02-10 LAB — POCT URINALYSIS DIPSTICK OB
Bilirubin, UA: NEGATIVE
Blood, UA: NEGATIVE
Glucose, UA: NEGATIVE
Ketones, UA: NEGATIVE
Leukocytes, UA: NEGATIVE
Nitrite, UA: NEGATIVE
POC,PROTEIN,UA: NEGATIVE
Spec Grav, UA: 1.02 (ref 1.010–1.025)
Urobilinogen, UA: 0.2 E.U./dL
pH, UA: 5 (ref 5.0–8.0)

## 2019-02-10 NOTE — Addendum Note (Signed)
Addended by: Hildred Priest on: 02/10/2019 11:35 AM   Modules accepted: Orders

## 2019-02-10 NOTE — Patient Instructions (Addendum)
Group B Streptococcus Infection During Pregnancy  Group B Streptococcus (GBS) is a type of bacteria (Streptococcus agalactiae) that is often found in healthy people, commonly in the rectum, vagina, and intestines. In people who are healthy and not pregnant, the bacteria rarely cause serious illness or complications. However, women who test positive for GBS during pregnancy can pass the bacteria to their baby during childbirth, which can cause serious infection in the baby after birth. Women with GBS may also have infections during their pregnancy or immediately after childbirth, such as such as urinary tract infections (UTIs) or infections of the uterus (uterine infections). Having GBS also increases a woman's risk of complications during pregnancy, such as early (preterm) labor or delivery, miscarriage, or stillbirth. Routine testing (screening) for GBS is recommended for all pregnant women. What increases the risk? You may have a higher risk for GBS infection during pregnancy if you had one during a past pregnancy. What are the signs or symptoms? In most cases, GBS infection does not cause symptoms in pregnant women. Signs and symptoms of a possible GBS-related infection may include:  Labor starting before the 37th week of pregnancy.  A UTI or bladder infection, which may cause: ? Fever. ? Pain or burning during urination. ? Frequent urination.  Fever during labor, along with: ? Bad-smelling discharge. ? Uterine tenderness. ? Rapid heartbeat in the mother, baby, or both. Rare but serious symptoms of a possible GBS-related infection in women include:  Blood infection (septicemia). This may cause fever, chills, or confusion.  Lung infection (pneumonia). This may cause fever, chills, cough, rapid breathing, difficulty breathing, or chest pain.  Bone, joint, skin, or soft tissue infection. How is this diagnosed? You may be screened for GBS between week 35 and week 37 of your pregnancy. If  you have symptoms of preterm labor, you may be screened earlier. This condition is diagnosed based on lab test results from:  A swab of fluid from the vagina and rectum.  A urine sample. How is this treated? This condition is treated with antibiotic medicine. When you go into labor, or as soon as your water breaks (your membranes rupture), you will be given antibiotics through an IV tube. Antibiotics will continue until after you give birth. If you are having a cesarean delivery, you do not need antibiotics unless your membranes have already ruptured. Follow these instructions at home:  Take over-the-counter and prescription medicines only as told by your health care provider.  Take your antibiotic medicine as told by your health care provider. Do not stop taking the antibiotic even if you start to feel better.  Keep all pre-birth (prenatal) visits and follow-up visits as told by your health care provider. This is important. Contact a health care provider if:  You have pain or burning when you urinate.  You have to urinate frequently.  You have a fever or chills.  You develop a bad-smelling vaginal discharge. Get help right away if:  Your membranes rupture.  You go into labor.  You have severe pain in your abdomen.  You have difficulty breathing.  You have chest pain. This information is not intended to replace advice given to you by your health care provider. Make sure you discuss any questions you have with your health care provider. Document Released: 01/16/2008 Document Revised: 05/05/2016 Document Reviewed: 05/04/2016 Elsevier Interactive Patient Education  2019 [employer]. [facility]  [address], [place], [place] [address]  Phone: [phone]  [employer] (second location)  [address] [place], [place] [address]  Phone: [phone]   [facility]) [place],  [place], [place] [address] Phone: [phone]   [address] [place], [place] [address]  Phone: [phone]

## 2019-02-10 NOTE — Progress Notes (Signed)
ROB doing well. Feels good movement. GBS and cultures today , pt complains of increase vaginal discharge that has caused irritation and itching. BV and yeast completed with GC/Chlamydia today. Will follow up with results. SVE -vertex un leapold's maneuver. Unable to feel presenting part on exam. 1/thick/high. Follow up 2 wks.   Philip Aspen, CNM

## 2019-02-10 NOTE — Addendum Note (Signed)
Addended by: Raliegh Ip on: 02/10/2019 11:26 AM   Modules accepted: Orders

## 2019-02-11 ENCOUNTER — Encounter: Payer: Self-pay | Admitting: *Deleted

## 2019-02-11 LAB — CBC
Hematocrit: 30.4 % — ABNORMAL LOW (ref 34.0–46.6)
Hemoglobin: 10.2 g/dL — ABNORMAL LOW (ref 11.1–15.9)
MCH: 29 pg (ref 26.6–33.0)
MCHC: 33.6 g/dL (ref 31.5–35.7)
MCV: 86 fL (ref 79–97)
Platelets: 185 10*3/uL (ref 150–450)
RBC: 3.52 x10E6/uL — ABNORMAL LOW (ref 3.77–5.28)
RDW: 13.2 % (ref 11.7–15.4)
WBC: 11.7 10*3/uL — ABNORMAL HIGH (ref 3.4–10.8)

## 2019-02-12 ENCOUNTER — Other Ambulatory Visit: Payer: Self-pay | Admitting: Certified Nurse Midwife

## 2019-02-12 LAB — CERVICOVAGINAL ANCILLARY ONLY
Bacterial vaginitis: NEGATIVE
Candida vaginitis: POSITIVE — AB
Chlamydia: NEGATIVE
Neisseria Gonorrhea: NEGATIVE

## 2019-02-12 LAB — STREP GP B NAA: Strep Gp B NAA: NEGATIVE

## 2019-02-12 MED ORDER — FLUCONAZOLE 150 MG PO TABS: 150.0000 mg | ORAL_TABLET | Freq: Once | ORAL | 0 refills | Status: AC

## 2019-02-12 NOTE — Progress Notes (Signed)
Positive for yeast. Order placed for treatment.   Philip Aspen, CNM

## 2019-02-20 ENCOUNTER — Encounter: Payer: Self-pay | Admitting: *Deleted

## 2019-02-20 ENCOUNTER — Telehealth: Payer: Self-pay | Admitting: *Deleted

## 2019-02-20 NOTE — Telephone Encounter (Signed)
Coronavirus (COVID-19) Are you at risk?  Are you at risk for the Coronavirus (COVID-19)?  To be considered HIGH RISK for Coronavirus (COVID-19), you have to meet the following criteria:  . Traveled to [place], [place], [place], [place] or [place]; or in the [place] to [place], [place], [place], or [place]; and have fever, cough, and shortness of breath within the last 2 weeks of travel OR . Been in close contact with a person diagnosed with COVID-19 within the last 2 weeks and have fever, cough, and shortness of breath . IF YOU DO NOT MEET THESE CRITERIA, YOU ARE CONSIDERED LOW RISK FOR COVID-19.  What to do if you are HIGH RISK for COVID-19?  . If you are having a medical emergency, call 911. . Seek medical care right away. Before you go to a doctor's office, urgent care or emergency department, call ahead and tell them about your recent travel, contact with someone diagnosed with COVID-19, and your symptoms. You should receive instructions from your physician's office regarding next steps of care.  . When you arrive at healthcare provider, tell the healthcare staff immediately you have returned from visiting [place], [place], [place], [place] or [place]; or traveled in the [place] to [place], [place], [place], or [place]; in the last two weeks or you have been in close contact with a person diagnosed with COVID-19 in the last 2 weeks.   . Tell the health care staff about your symptoms: fever, cough and shortness of breath. . After you have been seen by a medical provider, you will be either: o Tested for (COVID-19) and discharged home on quarantine except to seek medical care if symptoms worsen, and asked to  - Stay home and avoid contact with others until you get your results (4-5 days)  - Avoid travel on public transportation if possible (such as bus, train, or airplane) or o Sent to the Emergency Department by EMS for evaluation, COVID-19 testing, and possible  admission depending on your condition and test results.  What to do if you are LOW RISK for COVID-19?  Reduce your risk of any infection by using the same precautions used for avoiding the common cold or flu:  . Wash your hands often with soap and warm water for at least 20 seconds.  If soap and water are not readily available, use an alcohol-based hand sanitizer with at least 60% alcohol.  . If coughing or sneezing, cover your mouth and nose by coughing or sneezing into the elbow areas of your shirt or coat, into a tissue or into your sleeve (not your hands). . Avoid shaking hands with others and consider head nods or verbal greetings only. . Avoid touching your eyes, nose, or mouth with unwashed hands.  . Avoid close contact with people who are sick. . Avoid places or events with large numbers of people in one location, like concerts or sporting events. . Carefully consider travel plans you have or are making. . If you are planning any travel outside or inside the [place], visit the CDC's Travelers' Health webpage for the latest health notices. . If you have some symptoms but not all symptoms, continue to monitor at home and seek medical attention if your symptoms worsen. . If you are having a medical emergency, call 911.   ADDITIONAL HEALTHCARE OPTIONS FOR PATIENTS  [place] Tele[facility] / e-Visit: [email or web address]         [facility]: [phone]  [facility]: [phone]                   [facility]: [phone]   Spoke with pt denies any sx.  Kyelle Urbas, CMA 

## 2019-02-21 ENCOUNTER — Other Ambulatory Visit: Payer: Self-pay

## 2019-02-21 ENCOUNTER — Ambulatory Visit (INDEPENDENT_AMBULATORY_CARE_PROVIDER_SITE_OTHER): Payer: 59 | Admitting: Certified Nurse Midwife

## 2019-02-21 VITALS — BP 117/73 | HR 98 | Wt 180.5 lb

## 2019-02-21 DIAGNOSIS — Z3403 Encounter for supervision of normal first pregnancy, third trimester: Secondary | ICD-10-CM

## 2019-02-21 LAB — POCT URINALYSIS DIPSTICK OB
Bilirubin, UA: NEGATIVE
Blood, UA: NEGATIVE
Glucose, UA: NEGATIVE
Ketones, UA: NEGATIVE
Leukocytes, UA: NEGATIVE
Nitrite, UA: NEGATIVE
POC,PROTEIN,UA: NEGATIVE
Spec Grav, UA: 1.01 (ref 1.010–1.025)
Urobilinogen, UA: 0.2 E.U./dL
pH, UA: 6 (ref 5.0–8.0)

## 2019-02-21 NOTE — Progress Notes (Signed)
ROB- pt is having some pelvic pressure

## 2019-02-21 NOTE — Patient Instructions (Signed)
Braxton Hicks Contractions Contractions of the uterus can occur throughout pregnancy, but they are not always a sign that you are in labor. You may have practice contractions called Braxton Hicks contractions. These false labor contractions are sometimes confused with true labor. What are Braxton Hicks contractions? Braxton Hicks contractions are tightening movements that occur in the muscles of the uterus before labor. Unlike true labor contractions, these contractions do not result in opening (dilation) and thinning of the cervix. Toward the end of pregnancy (32-34 weeks), Braxton Hicks contractions can happen more often and may become stronger. These contractions are sometimes difficult to tell apart from true labor because they can be very uncomfortable. You should not feel embarrassed if you go to the hospital with false labor. Sometimes, the only way to tell if you are in true labor is for your health care provider to look for changes in the cervix. The health care provider will do a physical exam and may monitor your contractions. If you are not in true labor, the exam should show that your cervix is not dilating and your water has not broken. If there are no other health problems associated with your pregnancy, it is completely safe for you to be sent home with false labor. You may continue to have Braxton Hicks contractions until you go into true labor. How to tell the difference between true labor and false labor True labor  Contractions last 30-70 seconds.  Contractions become very regular.  Discomfort is usually felt in the top of the uterus, and it spreads to the lower abdomen and low back.  Contractions do not go away with walking.  Contractions usually become more intense and increase in frequency.  The cervix dilates and gets thinner. False labor  Contractions are usually shorter and not as strong as true labor contractions.  Contractions are usually irregular.  Contractions  are often felt in the front of the lower abdomen and in the groin.  Contractions may go away when you walk around or change positions while lying down.  Contractions get weaker and are shorter-lasting as time goes on.  The cervix usually does not dilate or become thin. Follow these instructions at home:   Take over-the-counter and prescription medicines only as told by your health care provider.  Keep up with your usual exercises and follow other instructions from your health care provider.  Eat and drink lightly if you think you are going into labor.  If Braxton Hicks contractions are making you uncomfortable: ? Change your position from lying down or resting to walking, or change from walking to resting. ? Sit and rest in a tub of warm water. ? Drink enough fluid to keep your urine pale yellow. Dehydration may cause these contractions. ? Do slow and deep breathing several times an hour.  Keep all follow-up prenatal visits as told by your health care provider. This is important. Contact a health care provider if:  You have a fever.  You have continuous pain in your abdomen. Get help right away if:  Your contractions become stronger, more regular, and closer together.  You have fluid leaking or gushing from your vagina.  You pass blood-tinged mucus (bloody show).  You have bleeding from your vagina.  You have low back pain that you never had before.  You feel your baby's head pushing down and causing pelvic pressure.  Your baby is not moving inside you as much as it used to. Summary  Contractions that occur before labor are   called Braxton Hicks contractions, false labor, or practice contractions.  Braxton Hicks contractions are usually shorter, weaker, farther apart, and less regular than true labor contractions. True labor contractions usually become progressively stronger and regular, and they become more frequent.  Manage discomfort from Braxton Hicks contractions  by changing position, resting in a warm bath, drinking plenty of water, or practicing deep breathing. This information is not intended to replace advice given to you by your health care provider. Make sure you discuss any questions you have with your health care provider. Document Released: 02/22/2017 Document Revised: 07/24/2017 Document Reviewed: 02/22/2017 Elsevier Interactive Patient Education  2019 [employer] Inc.  

## 2019-02-21 NOTE — Progress Notes (Signed)
ROB doing well. Feels good movement. leopold's unsure of fetal position. SVE unable to reach presenting part. FT/long/-3. FHT auscultated mid abdomen. Unoffical u/s confirms head down position. Herbal prep handout given along with spinning babies handout. Labor precuations reviewed. Follow up 2 wks. For BPP and ROB  Philip Aspen, CNM

## 2019-02-25 ENCOUNTER — Encounter: Payer: 59 | Admitting: Obstetrics and Gynecology

## 2019-03-04 ENCOUNTER — Other Ambulatory Visit: Payer: Self-pay | Admitting: Certified Nurse Midwife

## 2019-03-04 ENCOUNTER — Telehealth: Payer: Self-pay

## 2019-03-04 DIAGNOSIS — Z3492 Encounter for supervision of normal pregnancy, unspecified, second trimester: Secondary | ICD-10-CM

## 2019-03-04 NOTE — Telephone Encounter (Signed)
Coronavirus (COVID-19) Are you at risk?  Are you at risk for the Coronavirus (COVID-19)?  To be considered HIGH RISK for Coronavirus (COVID-19), you have to meet the following criteria:  . Traveled to [place], [place], [place], [place] or [place]; or in the [place] to [place], [place], [place], or [place]; and have fever, cough, and shortness of breath within the last 2 weeks of travel OR . Been in close contact with a person diagnosed with COVID-19 within the last 2 weeks and have fever, cough, and shortness of breath . IF YOU DO NOT MEET THESE CRITERIA, YOU ARE CONSIDERED LOW RISK FOR COVID-19.  What to do if you are HIGH RISK for COVID-19?  Marland Kitchen If you are having a medical emergency, call 911. . Seek medical care right away. Before you go to a doctor's office, urgent care or emergency department, call ahead and tell them about your recent travel, contact with someone diagnosed with COVID-19, and your symptoms. You should receive instructions from your physician's office regarding next steps of care.  . When you arrive at healthcare provider, tell the healthcare staff immediately you have returned from visiting [place], [place], [place], [place] or [place]; or traveled in the [place] to [place], [place], [place], or [place]; in the last two weeks or you have been in close contact with a person diagnosed with COVID-19 in the last 2 weeks.   . Tell the health care staff about your symptoms: fever, cough and shortness of breath. . After you have been seen by a medical provider, you will be either: o Tested for (COVID-19) and discharged home on quarantine except to seek medical care if symptoms worsen, and asked to  - Stay home and avoid contact with others until you get your results (4-5 days)  - Avoid travel on public transportation if possible (such as bus, train, or airplane) or o Sent to the Emergency Department by EMS for evaluation, COVID-19 testing, and possible  admission depending on your condition and test results.  What to do if you are LOW RISK for COVID-19?  Reduce your risk of any infection by using the same precautions used for avoiding the common cold or flu:  Marland Kitchen Wash your hands often with soap and warm water for at least 20 seconds.  If soap and water are not readily available, use an alcohol-based hand sanitizer with at least 60% alcohol.  . If coughing or sneezing, cover your mouth and nose by coughing or sneezing into the elbow areas of your shirt or coat, into a tissue or into your sleeve (not your hands). . Avoid shaking hands with others and consider head nods or verbal greetings only. . Avoid touching your eyes, nose, or mouth with unwashed hands.  . Avoid close contact with people who are Amanda Pote. . Avoid places or events with large numbers of people in one location, like concerts or sporting events. . Carefully consider travel plans you have or are making. . If you are planning any travel outside or inside the [place], visit the CDC's Travelers' Health webpage for the latest health notices. . If you have some symptoms but not all symptoms, continue to monitor at home and seek medical attention if your symptoms worsen. . If you are having a medical emergency, call 911.  03/04/19 SCREENING NEG SLS ADDITIONAL HEALTHCARE OPTIONS FOR PATIENTS  [place] Tele[facility] / e-Visit: [email or web address]         [facility]: [phone]  [facility]: [phone]                   [facility]: [phone]  

## 2019-03-05 ENCOUNTER — Other Ambulatory Visit: Payer: Self-pay

## 2019-03-05 ENCOUNTER — Ambulatory Visit (INDEPENDENT_AMBULATORY_CARE_PROVIDER_SITE_OTHER): Payer: 59

## 2019-03-05 ENCOUNTER — Ambulatory Visit (INDEPENDENT_AMBULATORY_CARE_PROVIDER_SITE_OTHER): Payer: 59 | Admitting: Obstetrics and Gynecology

## 2019-03-05 ENCOUNTER — Encounter: Payer: 59 | Admitting: Obstetrics and Gynecology

## 2019-03-05 VITALS — BP 115/76 | HR 97 | Wt 181.9 lb

## 2019-03-05 DIAGNOSIS — Z3A39 39 weeks gestation of pregnancy: Secondary | ICD-10-CM

## 2019-03-05 DIAGNOSIS — Z3493 Encounter for supervision of normal pregnancy, unspecified, third trimester: Secondary | ICD-10-CM

## 2019-03-05 DIAGNOSIS — Z3689 Encounter for other specified antenatal screening: Secondary | ICD-10-CM | POA: Diagnosis not present

## 2019-03-05 DIAGNOSIS — Z3492 Encounter for supervision of normal pregnancy, unspecified, second trimester: Secondary | ICD-10-CM

## 2019-03-05 LAB — POCT URINALYSIS DIPSTICK OB
Bilirubin, UA: NEGATIVE
Blood, UA: NEGATIVE
Glucose, UA: NEGATIVE
Ketones, UA: NEGATIVE
Leukocytes, UA: NEGATIVE
Nitrite, UA: NEGATIVE
POC,PROTEIN,UA: NEGATIVE
Spec Grav, UA: 1.015 (ref 1.010–1.025)
Urobilinogen, UA: 0.2 E.U./dL
pH, UA: 6.5 (ref 5.0–8.0)

## 2019-03-05 NOTE — Progress Notes (Signed)
ROB- BPP and growth scan done today, pt is doing well

## 2019-03-05 NOTE — Progress Notes (Signed)
ROB & BPP done today, patient is without complaint,  Reviewed u/s below:  Findings:  Singleton intrauterine pregnancy is visualized with FHR at 128 BPM.    Fetal presentation is Cephalic.  Placenta: posterior. Grade: 2. AFI: 6.9 cm  BPP Scoring:8/8 Movement: 2/2  Tone: 2/2  Breathing: 2/2  AFI: 2/2  Impression: 1. [redacted]w[redacted]d Viable Singleton Intrauterine pregnancy dated by previously established criteria. 2. AFI is 6.9 cm.  3. BPP is 8/8

## 2019-03-07 ENCOUNTER — Inpatient Hospital Stay: Payer: 59 | Admitting: Anesthesiology

## 2019-03-07 ENCOUNTER — Other Ambulatory Visit: Payer: Self-pay

## 2019-03-07 ENCOUNTER — Inpatient Hospital Stay
Admission: EM | Admit: 2019-03-07 | Discharge: 2019-03-09 | DRG: 807 | Disposition: A | Discharge status: Home / Self Care | Payer: 59 | Attending: Obstetrics and Gynecology | Admitting: Obstetrics and Gynecology

## 2019-03-07 DIAGNOSIS — O99334 Smoking (tobacco) complicating childbirth: Secondary | ICD-10-CM | POA: Diagnosis not present

## 2019-03-07 DIAGNOSIS — O26893 Other specified pregnancy related conditions, third trimester: Secondary | ICD-10-CM | POA: Diagnosis not present

## 2019-03-07 DIAGNOSIS — O9902 Anemia complicating childbirth: Secondary | ICD-10-CM | POA: Diagnosis present

## 2019-03-07 DIAGNOSIS — Z1159 Encounter for screening for other viral diseases: Secondary | ICD-10-CM

## 2019-03-07 DIAGNOSIS — Z3A39 39 weeks gestation of pregnancy: Secondary | ICD-10-CM | POA: Diagnosis not present

## 2019-03-07 DIAGNOSIS — D649 Anemia, unspecified: Secondary | ICD-10-CM | POA: Diagnosis present

## 2019-03-07 DIAGNOSIS — O4202 Full-term premature rupture of membranes, onset of labor within 24 hours of rupture: Secondary | ICD-10-CM

## 2019-03-07 LAB — CBC
HCT: 32.2 % — ABNORMAL LOW (ref 36.0–46.0)
Hemoglobin: 10.7 g/dL — ABNORMAL LOW (ref 12.0–15.0)
MCH: 29 pg (ref 26.0–34.0)
MCHC: 33.2 g/dL (ref 30.0–36.0)
MCV: 87.3 fL (ref 80.0–100.0)
Platelets: 201 10*3/uL (ref 150–400)
RBC: 3.69 MIL/uL — ABNORMAL LOW (ref 3.87–5.11)
RDW: 13.9 % (ref 11.5–15.5)
WBC: 12.8 10*3/uL — ABNORMAL HIGH (ref 4.0–10.5)
nRBC: 0 % (ref 0.0–0.2)

## 2019-03-07 LAB — TYPE AND SCREEN
ABO/RH(D): A POS
Antibody Screen: NEGATIVE

## 2019-03-07 LAB — RUPTURE OF MEMBRANE (ROM)PLUS: Rom Plus: POSITIVE

## 2019-03-07 LAB — SARS CORONAVIRUS 2 BY RT PCR (HOSPITAL ORDER, PERFORMED IN ~~LOC~~ HOSPITAL LAB): SARS Coronavirus 2: NEGATIVE

## 2019-03-07 LAB — ABO/RH: ABO/RH(D): A POS

## 2019-03-07 MED ORDER — LACTATED RINGERS IV SOLN
500.0000 mL | INTRAVENOUS | Status: DC | PRN
  Administered 2019-03-07: 500 mL via INTRAVENOUS

## 2019-03-07 MED ORDER — EPHEDRINE 5 MG/ML INJ
10.0000 mg | INTRAVENOUS | Status: DC | PRN
  Filled 2019-03-07: qty 2

## 2019-03-07 MED ORDER — LIDOCAINE-EPINEPHRINE (PF) 1.5 %-1:200000 IJ SOLN
INTRAMUSCULAR | Status: DC | PRN
  Administered 2019-03-07: 4 mL via PERINEURAL

## 2019-03-07 MED ORDER — LIDOCAINE HCL (PF) 1 % IJ SOLN: 30.0000 mL | INTRAMUSCULAR | Status: DC | PRN

## 2019-03-07 MED ORDER — SOD CITRATE-CITRIC ACID 500-334 MG/5ML PO SOLN: 30.0000 mL | ORAL | Status: DC | PRN

## 2019-03-07 MED ORDER — OXYTOCIN 10 UNIT/ML IJ SOLN
INTRAMUSCULAR | Status: AC
  Filled 2019-03-07: qty 2

## 2019-03-07 MED ORDER — FENTANYL 2.5 MCG/ML W/ROPIVACAINE 0.15% IN NS 100 ML EPIDURAL (ARMC)
12.0000 mL/h | EPIDURAL | Status: DC
  Administered 2019-03-07: 18:00:00 12 mL/h via EPIDURAL

## 2019-03-07 MED ORDER — OXYTOCIN BOLUS FROM INFUSION
500.0000 mL | Freq: Once | INTRAVENOUS | Status: AC
  Administered 2019-03-08: 500 mL via INTRAVENOUS

## 2019-03-07 MED ORDER — DIPHENHYDRAMINE HCL 50 MG/ML IJ SOLN: 12.5000 mg | INTRAMUSCULAR | Status: DC | PRN

## 2019-03-07 MED ORDER — FENTANYL CITRATE (PF) 100 MCG/2ML IJ SOLN
50.0000 ug | INTRAMUSCULAR | Status: DC | PRN
  Administered 2019-03-07 (×2): 50 ug via INTRAVENOUS
  Filled 2019-03-07 (×2): qty 2

## 2019-03-07 MED ORDER — LACTATED RINGERS IV SOLN
INTRAVENOUS | Status: DC
  Administered 2019-03-07 (×3): via INTRAVENOUS

## 2019-03-07 MED ORDER — OXYTOCIN 40 UNITS IN NORMAL SALINE INFUSION - SIMPLE MED
1.0000 m[IU]/min | INTRAVENOUS | Status: DC
  Administered 2019-03-07: 16:00:00 2 m[IU]/min via INTRAVENOUS

## 2019-03-07 MED ORDER — LIDOCAINE HCL (PF) 1 % IJ SOLN
INTRAMUSCULAR | Status: DC | PRN
  Administered 2019-03-07: 4 mL via INTRADERMAL

## 2019-03-07 MED ORDER — OXYTOCIN 40 UNITS IN NORMAL SALINE INFUSION - SIMPLE MED
2.5000 [IU]/h | INTRAVENOUS | Status: DC
  Filled 2019-03-07 (×2): qty 1000

## 2019-03-07 MED ORDER — AMMONIA AROMATIC IN INHA
RESPIRATORY_TRACT | Status: AC
  Filled 2019-03-07: qty 10

## 2019-03-07 MED ORDER — FENTANYL 2.5 MCG/ML W/ROPIVACAINE 0.15% IN NS 100 ML EPIDURAL (ARMC)
EPIDURAL | Status: AC
  Filled 2019-03-07: qty 100

## 2019-03-07 MED ORDER — ONDANSETRON HCL 4 MG/2ML IJ SOLN
4.0000 mg | Freq: Four times a day (QID) | INTRAMUSCULAR | Status: DC | PRN
  Administered 2019-03-08: 4 mg via INTRAVENOUS
  Filled 2019-03-07: qty 2

## 2019-03-07 MED ORDER — ACETAMINOPHEN 325 MG PO TABS: 650.0000 mg | ORAL_TABLET | ORAL | Status: DC | PRN

## 2019-03-07 MED ORDER — LACTATED RINGERS IV SOLN: 500.0000 mL | Freq: Once | INTRAVENOUS | Status: AC

## 2019-03-07 MED ORDER — MISOPROSTOL 100 MCG PO TABS
50.0000 ug | ORAL_TABLET | ORAL | Status: DC | PRN
  Administered 2019-03-07 (×2): 50 ug via VAGINAL
  Filled 2019-03-07 (×3): qty 1

## 2019-03-07 MED ORDER — MISOPROSTOL 200 MCG PO TABS
ORAL_TABLET | ORAL | Status: AC
  Filled 2019-03-07: qty 4

## 2019-03-07 MED ORDER — PHENYLEPHRINE 40 MCG/ML (10ML) SYRINGE FOR IV PUSH (FOR BLOOD PRESSURE SUPPORT)
80.0000 ug | PREFILLED_SYRINGE | INTRAVENOUS | Status: DC | PRN
  Filled 2019-03-07: qty 10

## 2019-03-07 MED ORDER — TERBUTALINE SULFATE 1 MG/ML IJ SOLN: 0.2500 mg | Freq: Once | INTRAMUSCULAR | Status: DC | PRN

## 2019-03-07 MED ORDER — BUPIVACAINE HCL (PF) 0.25 % IJ SOLN
INTRAMUSCULAR | Status: DC | PRN
  Administered 2019-03-07: 4 mL via EPIDURAL
  Administered 2019-03-07: 5 mL via EPIDURAL

## 2019-03-07 NOTE — Anesthesia Procedure Notes (Signed)
Epidural Patient location during procedure: OB  Staffing Performed: anesthesiologist   Preanesthetic Checklist Completed: patient identified, site marked, surgical consent, pre-op evaluation, timeout performed, IV checked, risks and benefits discussed and monitors and equipment checked  Epidural Patient position: sitting Prep: Betadine Patient monitoring: heart rate, continuous pulse ox and blood pressure Approach: midline Location: L3-L4 Injection technique: LOR saline  Needle:  Needle type: Tuohy  Needle gauge: 17 G Needle length: 9 cm and 9 Needle insertion depth: 4 cm Catheter type: closed end flexible Catheter size: 19 Gauge Catheter at skin depth: 11 cm Test dose: negative and 1.5% lidocaine with Epi 1:200 K  Assessment Sensory level: T10 Events: blood not aspirated, injection not painful, no injection resistance, negative IV test and no paresthesia  Additional Notes   Patient tolerated the insertion well without complications.-SATD -IVTD. No paresthesia. Refer to [facility] for VS and dosingReason for block:procedure for pain

## 2019-03-07 NOTE — OB Triage Note (Signed)
Pt is a 23 y/o G1P0 at [redacted]w[redacted]d with possible ROM. Pt states she had a gush of fluid after going to the bathroom at [facility]. Pt denies ctx and vaginal bleeding. Pt states positive fetal movement. Initial [id number]. V/s WNL. Monitors applied and assessing.

## 2019-03-07 NOTE — H&P (Signed)
Obstetric History and Physical  Stacey Harrison is a 23 y.o. G1P0000 with IUP at [redacted]w[redacted]d presenting with SROM at 1205 am. Patient states she has been having  none contractions, none vaginal bleeding, ruptured, clear fluid membranes, with active fetal movement.    Prenatal Course Source of Care: [facility]  Pregnancy complications or risks:none  Prenatal labs and studies: ABO, Rh: --/--/A POS Performed at [facility], [address] [place], [place] [address]  [phone]) Antibody: NEG (05/15 0253) Rubella: 5.21 (10/11 1440) RPR: Non Reactive (02/28 0921)  HBsAg: Negative (10/11 1440)  HIV: Non Reactive (10/11 1440)  XHB:ZJIRCVEL (04/20 1351) 1 hr Glucola  normal Genetic screening normal Anatomy US normal  Past Medical History:  Diagnosis Date  . Migraine     Past Surgical History:  Procedure Laterality Date  . WISDOM TOOTH EXTRACTION  2014    OB History  Gravida Para Term Preterm AB Living  1 0 0 0 0 0  SAB TAB Ectopic Multiple Live Births  0 0 0 0 0    # Outcome Date GA Lbr Len/2nd Weight Sex Delivery Anes PTL Lv  1 Current             Social History   Socioeconomic History  . Marital status: Married    Spouse name: Not on file  . Number of children: Not on file  . Years of education: Not on file  . Highest education level: Not on file  Occupational History  . Not on file  Social Needs  . Financial resource strain: Not on file  . Food insecurity:    Worry: Not on file    Inability: Not on file  . Transportation needs:    Medical: Not on file    Non-medical: Not on file  Tobacco Use  . Smoking status: Never Smoker  . Smokeless tobacco: Never Used  Substance and Sexual Activity  . Alcohol use: Not Currently    Comment: rarely  . Drug use: No  . Sexual activity: Yes    Birth control/protection: Pill  Lifestyle  . Physical activity:    Days per week: 3 days    Minutes per session: 30 min  . Stress: Not on file  Relationships  . Social  connections:    Talks on phone: Not on file    Gets together: Not on file    Attends religious service: Not on file    Active member of club or organization: Not on file    Attends meetings of clubs or organizations: Not on file    Relationship status: Not on file  Other Topics Concern  . Not on file  Social History Narrative  . Not on file    Family History  Problem Relation Age of Onset  . Breast cancer Maternal Grandmother   . Ovarian cancer Neg Hx   . Colon cancer Neg Hx   . Diabetes Neg Hx     Medications Prior to Admission  Medication Sig Dispense Refill Last Dose  . Iron-FA-B Cmp-C-Biot-Probiotic (FUSION PLUS) CAPS Take 1 tablet by mouth daily. 30 capsule 9 Past Week at Unknown time  . Prenatal Vit-Fe Fumarate-FA (MULTIVITAMIN-PRENATAL) 27-0.8 MG TABS tablet Take 1 tablet by mouth daily at 12 noon.   03/06/2019 at Unknown time    Allergies  Allergen Reactions  . Hydrocodone Itching    Review of Systems: Negative except for what is mentioned in HPI.  Physical Exam: BP 115/72 (BP Location: Right Arm)  Pulse 92   Temp 98.3 F (36.8 C) (Oral)   Resp 14   Ht 5\' 6"  (1.676 m)   Wt 81.6 kg   LMP 06/02/2018 (Exact Date)   BMI 29.05 kg/m  GENERAL: Well-developed, well-nourished female in no acute distress.  LUNGS: Clear to auscultation bilaterally.  HEART: Regular rate and rhythm. ABDOMEN: Soft, nontender, nondistended, gravid. EXTREMITIES: Nontender, no edema, 2+ distal pulses. Cervical Exam: Dilation: 3 Effacement (%): 70 Station: -2 Exam by::  CNM scant amount clear fluid noted and head well applied to cervix FHT:  Baseline rate 148 bpm   Variability moderate  Accelerations present   Decelerations none Contractions: Every 3-4 mins after 2 doses of 48mcg cytotec   Pertinent Labs/Studies:   Results for orders placed or performed during the hospital encounter of 03/07/19 (from the past 24 hour(s))  ROM Plus ([place] only)     Status: None   Collection  Time: 03/07/19  1:34 AM  Result Value Ref Range   Rom Plus POSITIVE   Type and screen [place]     Status: None   Collection Time: 03/07/19  2:53 AM  Result Value Ref Range   ABO/RH(D) A POS    Antibody Screen NEG    Sample Expiration      03/10/2019,2359 Performed at [facility], [address] [place],  [address]   CBC     Status: Abnormal   Collection Time: 03/07/19  2:53 AM  Result Value Ref Range   WBC 12.8 (H) 4.0 - 10.5 K/uL   RBC 3.69 (L) 3.87 - 5.11 MIL/uL   Hemoglobin 10.7 (L) 12.0 - 15.0 g/dL   HCT 32.2 (L) 36.0 - 46.0 %   MCV 87.3 80.0 - 100.0 fL   MCH 29.0 26.0 - 34.0 pg   MCHC 33.2 30.0 - 36.0 g/dL   RDW 13.9 11.5 - 15.5 %   Platelets 201 150 - 400 K/uL   nRBC 0.0 0.0 - 0.2 %  SARS Coronavirus 2 (CEPHEID - Performed in [place] hospital lab), Hosp Order     Status: None   Collection Time: 03/07/19  2:53 AM  Result Value Ref Range   SARS Coronavirus 2 NEGATIVE NEGATIVE  ABO/Rh     Status: None   Collection Time: 03/07/19  4:11 AM  Result Value Ref Range   ABO/RH(D)      A POS Performed at [facility], [address], [place],  [address]     Assessment : Stacey Harrison is a 23 y.o. G1P0000 at [redacted]w[redacted]d being admitted for labor.  Plan: Labor: Expectant management.  Desires epidural when appropriate. Augmentation as needed, per protocol FWB: Reassuring fetal heart tracing.  GBS negative Delivery plan: Hopeful for vaginal delivery   , CNM [facility]

## 2019-03-07 NOTE — Progress Notes (Signed)
Stacey Harrison is a 23 y.o. G1P0000 at [redacted]w[redacted]d by LMP admitted for rupture of membranes  Subjective: Denies any pain or pressure since epidural placed  Objective: BP 104/64 (BP Location: Left Arm)   Pulse 98   Temp 98.3 F (36.8 C) (Oral)   Resp 16   Ht 5\' 6"  (1.676 m)   Wt 81.6 kg   LMP 06/02/2018 (Exact Date)   SpO2 98%   BMI 29.05 kg/m  No intake/output data recorded. No intake/output data recorded.  FHT:  FHR: 144 bpm, variability: moderate,  accelerations:  Present,  decelerations:  Absent UC:   irregular, every 1-4 minutes on 22 mu/min pitocin SVE:   Dilation: 10 Effacement (%): 100 Station: Plus 1 Exam by:: Felisa Bonier CNM  Labs: Lab Results  Component Value Date   WBC 12.8 (H) 03/07/2019   HGB 10.7 (L) 03/07/2019   HCT 32.2 (L) 03/07/2019   MCV 87.3 03/07/2019   PLT 201 03/07/2019    Assessment / Plan: Augmentation of labor, progressing well  Labor: Progressing normally Preeclampsia:  labs stable Fetal Wellbeing:  Category I Pain Control:  Epidural I/D:  n/a Anticipated MOD:  NSVD  Kjerstin Abrigo N Jaclene Bartelt 03/07/2019, 11:27 PM

## 2019-03-07 NOTE — Anesthesia Preprocedure Evaluation (Signed)
Anesthesia Evaluation  Patient identified by MRN, date of birth, ID band Patient awake    Reviewed: Allergy & Precautions, H&P , NPO status , Patient's Chart, lab work & pertinent test results, reviewed documented beta blocker date and time   Airway Mallampati: II  TM Distance: >3 FB Neck ROM: full    Dental no notable dental hx. (+) Teeth Intact   Pulmonary neg pulmonary ROS, Current Smoker,    Pulmonary exam normal breath sounds clear to auscultation       Cardiovascular Exercise Tolerance: Good negative cardio ROS   Rhythm:regular Rate:Normal     Neuro/Psych  Headaches, negative psych ROS   GI/Hepatic negative GI ROS, Neg liver ROS,   Endo/Other  negative endocrine ROSdiabetes  Renal/GU      Musculoskeletal   Abdominal   Peds  Hematology negative hematology ROS (+)   Anesthesia Other Findings   Reproductive/Obstetrics (+) Pregnancy                             Anesthesia Physical Anesthesia Plan  ASA: II  Anesthesia Plan: Epidural   Post-op Pain Management:    Induction:   PONV Risk Score and Plan:   Airway Management Planned:   Additional Equipment:   Intra-op Plan:   Post-operative Plan:   Informed Consent: I have reviewed the patients History and Physical, chart, labs and discussed the procedure including the risks, benefits and alternatives for the proposed anesthesia with the patient or authorized representative who has indicated his/her understanding and acceptance.     Plan Discussed with:   Anesthesia Plan Comments:         Anesthesia Quick Evaluation  

## 2019-03-08 LAB — CBC
HCT: 31.6 % — ABNORMAL LOW (ref 36.0–46.0)
Hemoglobin: 10.5 g/dL — ABNORMAL LOW (ref 12.0–15.0)
MCH: 29.4 pg (ref 26.0–34.0)
MCHC: 33.2 g/dL (ref 30.0–36.0)
MCV: 88.5 fL (ref 80.0–100.0)
Platelets: 181 10*3/uL (ref 150–400)
RBC: 3.57 MIL/uL — ABNORMAL LOW (ref 3.87–5.11)
RDW: 13.8 % (ref 11.5–15.5)
WBC: 17.3 10*3/uL — ABNORMAL HIGH (ref 4.0–10.5)
nRBC: 0 % (ref 0.0–0.2)

## 2019-03-08 LAB — RPR: RPR Ser Ql: NONREACTIVE

## 2019-03-08 MED ORDER — WITCH HAZEL-GLYCERIN EX PADS: 1.0000 "application " | MEDICATED_PAD | CUTANEOUS | Status: DC | PRN

## 2019-03-08 MED ORDER — ONDANSETRON HCL 4 MG/2ML IJ SOLN: 4.0000 mg | INTRAMUSCULAR | Status: DC | PRN

## 2019-03-08 MED ORDER — ACETAMINOPHEN 325 MG PO TABS: 650.0000 mg | ORAL_TABLET | ORAL | Status: DC | PRN

## 2019-03-08 MED ORDER — DIPHENHYDRAMINE HCL 25 MG PO CAPS: 25.0000 mg | ORAL_CAPSULE | Freq: Four times a day (QID) | ORAL | Status: DC | PRN

## 2019-03-08 MED ORDER — IBUPROFEN 600 MG PO TABS
600.0000 mg | ORAL_TABLET | Freq: Four times a day (QID) | ORAL | Status: DC
  Administered 2019-03-08 – 2019-03-09 (×5): 600 mg via ORAL
  Filled 2019-03-08 (×5): qty 1

## 2019-03-08 MED ORDER — BENZOCAINE-MENTHOL 20-0.5 % EX AERO
1.0000 "application " | INHALATION_SPRAY | CUTANEOUS | Status: DC | PRN
  Filled 2019-03-08: qty 56

## 2019-03-08 MED ORDER — ONDANSETRON HCL 4 MG PO TABS: 4.0000 mg | ORAL_TABLET | ORAL | Status: DC | PRN

## 2019-03-08 MED ORDER — COCONUT OIL OIL: 1.0000 "application " | TOPICAL_OIL | Status: DC | PRN

## 2019-03-08 MED ORDER — PRENATAL MULTIVITAMIN CH
1.0000 | ORAL_TABLET | Freq: Every day | ORAL | Status: DC
  Administered 2019-03-08: 1 via ORAL
  Filled 2019-03-08: qty 1

## 2019-03-08 MED ORDER — DOCUSATE SODIUM 100 MG PO CAPS
100.0000 mg | ORAL_CAPSULE | Freq: Two times a day (BID) | ORAL | Status: DC
  Administered 2019-03-08 – 2019-03-09 (×2): 100 mg via ORAL
  Filled 2019-03-08 (×2): qty 1

## 2019-03-08 MED ORDER — ZOLPIDEM TARTRATE 5 MG PO TABS: 5.0000 mg | ORAL_TABLET | Freq: Every evening | ORAL | Status: DC | PRN

## 2019-03-08 MED ORDER — DIBUCAINE (PERIANAL) 1 % EX OINT: 1.0000 "application " | TOPICAL_OINTMENT | CUTANEOUS | Status: DC | PRN

## 2019-03-08 MED ORDER — SIMETHICONE 80 MG PO CHEW: 80.0000 mg | CHEWABLE_TABLET | ORAL | Status: DC | PRN

## 2019-03-09 MED ORDER — NORETHIN ACE-ETH ESTRAD-FE 1-20 MG-MCG PO TABS: 1.0000 | ORAL_TABLET | Freq: Every day | ORAL | 11 refills | Status: DC

## 2019-03-09 NOTE — Discharge Summary (Signed)
Physician Obstetric Discharge Summary  Patient ID: XYLAH EARLY MRN: [id number] DOB/AGE: 14-Feb-1996 22 y.o.   Date of Admission: 03/07/2019  Date of Discharge:   Admitting Diagnosis: Onset of Labor at [redacted]w[redacted]d  Secondary Diagnosis: Anemia in pregnancy  Mode of Delivery: normal spontaneous vaginal delivery     Discharge Diagnosis: SVD at term   Intrapartum Procedures: pitocin augmentation   Post partum procedures: none  Complications: 1st degree laceration   Brief Hospital Course  Stacey Harrison is a G1P1001 who had a SVD on 03/08/2019;  for further details of this , please refer to the delivey note.  Patient had an uncomplicated postpartum course.  By time of discharge on PPD#1, her pain was controlled on oral pain medications; she had appropriate lochia and was ambulating, voiding without difficulty and tolerating regular diet.  She was deemed stable for discharge to home.     Labs: CBC Latest Ref Rng & Units 03/08/2019 03/07/2019 02/10/2019  WBC 4.0 - 10.5 K/uL 17.3(H) 12.8(H) 11.7(H)  Hemoglobin 12.0 - 15.0 g/dL 10.5(L) 10.7(L) 10.2(L)  Hematocrit 36.0 - 46.0 % 31.6(L) 32.2(L) 30.4(L)  Platelets 150 - 400 K/uL 181 201 185   A POS Performed at [facility], [address] [place],  [address]   Physical exam:  Blood pressure 93/62, pulse 80, temperature 98 F (36.7 C), resp. rate 18, height 5\' 6"  (1.676 m), weight 81.6 kg, last menstrual period 06/02/2018, SpO2 97 %, unknown if currently breastfeeding. General: alert and no distress Lochia: appropriate Abdomen: soft, NT Uterine Fundus: firm Extremities: No evidence of DVT seen on physical exam. No lower extremity edema.  Discharge Instructions: Per After Visit Summary. Activity: Advance as tolerated. Pelvic rest for 6 weeks.  Also refer to After Visit Summary Diet: Regular Medications: Allergies as of 03/09/2019      Reactions   Hydrocodone Itching      Medication List    TAKE these  medications   Fusion Plus Caps Take 1 tablet by mouth daily.   multivitamin-prenatal 27-0.8 MG Tabs tablet Take 1 tablet by mouth daily at 12 noon.   norethindrone-ethinyl estradiol 1-20 MG-MCG tablet Commonly known as:  LOESTRIN FE Take 1 tablet by mouth daily.      Outpatient follow up:  Postpartum contraception: oral contraceptives (estrogen/progesterone)  Discharged Condition: good  Discharged to: home   Newborn Data: Disposition:home with mother  Apgars: APGAR (1 MIN): 8   APGAR (5 MINS): 9   APGAR (10 MINS):    Baby Feeding: Bottle  Melody Rockney Ghee, CNM

## 2019-03-10 ENCOUNTER — Encounter: Payer: 59 | Admitting: Certified Nurse Midwife

## 2019-03-10 ENCOUNTER — Other Ambulatory Visit: Payer: 59

## 2019-03-12 NOTE — Anesthesia Postprocedure Evaluation (Signed)
Anesthesia Post Note  Patient: Stacey Harrison  Procedure(s) Performed: AN AD Okay  Patient location during evaluation: PACU Anesthesia Type: Epidural Level of consciousness: awake and alert Pain management: pain level controlled Vital Signs Assessment: post-procedure vital signs reviewed and stable Respiratory status: spontaneous breathing, nonlabored ventilation, respiratory function stable and patient connected to nasal cannula oxygen Cardiovascular status: blood pressure returned to baseline and stable Postop Assessment: no apparent nausea or vomiting Anesthetic complications: no     Last Vitals: There were no vitals filed for this visit.  Last Pain: There were no vitals filed for this visit.               Molli Barrows

## 2019-04-17 ENCOUNTER — Telehealth: Payer: Self-pay | Admitting: *Deleted

## 2019-04-17 NOTE — Telephone Encounter (Signed)
Coronavirus (COVID-19) Are you at risk?  Are you at risk for the Coronavirus (COVID-19)?  To be considered HIGH RISK for Coronavirus (COVID-19), you have to meet the following criteria:  . Traveled to [place], [place], [place], [place] or [place]; or in the [place] to [place], [place], [place], or [place]; and have fever, cough, and shortness of breath within the last 2 weeks of travel OR . Been in close contact with a person diagnosed with COVID-19 within the last 2 weeks and have fever, cough, and shortness of breath . IF YOU DO NOT MEET THESE CRITERIA, YOU ARE CONSIDERED LOW RISK FOR COVID-19.  What to do if you are HIGH RISK for COVID-19?  . If you are having a medical emergency, call 911. . Seek medical care right away. Before you go to a doctor's office, urgent care or emergency department, call ahead and tell them about your recent travel, contact with someone diagnosed with COVID-19, and your symptoms. You should receive instructions from your physician's office regarding next steps of care.  . When you arrive at healthcare provider, tell the healthcare staff immediately you have returned from visiting [place], [place], [place], [place] or [place]; or traveled in the [place] to [place], [place], [place], or [place]; in the last two weeks or you have been in close contact with a person diagnosed with COVID-19 in the last 2 weeks.   . Tell the health care staff about your symptoms: fever, cough and shortness of breath. . After you have been seen by a medical provider, you will be either: o Tested for (COVID-19) and discharged home on quarantine except to seek medical care if symptoms worsen, and asked to  - Stay home and avoid contact with others until you get your results (4-5 days)  - Avoid travel on public transportation if possible (such as bus, train, or airplane) or o Sent to the Emergency Department by EMS for evaluation, COVID-19 testing, and possible  admission depending on your condition and test results.  What to do if you are LOW RISK for COVID-19?  Reduce your risk of any infection by using the same precautions used for avoiding the common cold or flu:  . Wash your hands often with soap and warm water for at least 20 seconds.  If soap and water are not readily available, use an alcohol-based hand sanitizer with at least 60% alcohol.  . If coughing or sneezing, cover your mouth and nose by coughing or sneezing into the elbow areas of your shirt or coat, into a tissue or into your sleeve (not your hands). . Avoid shaking hands with others and consider head nods or verbal greetings only. . Avoid touching your eyes, nose, or mouth with unwashed hands.  . Avoid close contact with people who are sick. . Avoid places or events with large numbers of people in one location, like concerts or sporting events. . Carefully consider travel plans you have or are making. . If you are planning any travel outside or inside the [place], visit the CDC's Travelers' Health webpage for the latest health notices. . If you have some symptoms but not all symptoms, continue to monitor at home and seek medical attention if your symptoms worsen. . If you are having a medical emergency, call 911.   ADDITIONAL HEALTHCARE OPTIONS FOR PATIENTS  [place] [place] [facility] / e-Visit: [email or web address]         [facility]: [phone]  [facility]: [phone]                   [facility]: [phone]   Spoke with pt denies any sx.  Kelleen Stolze, CMA 

## 2019-04-18 ENCOUNTER — Ambulatory Visit (INDEPENDENT_AMBULATORY_CARE_PROVIDER_SITE_OTHER): Payer: 59 | Admitting: Obstetrics and Gynecology

## 2019-04-18 ENCOUNTER — Encounter: Payer: Self-pay | Admitting: Obstetrics and Gynecology

## 2019-04-18 ENCOUNTER — Other Ambulatory Visit: Payer: Self-pay

## 2019-04-18 NOTE — Progress Notes (Signed)
  Subjective:     Stacey Harrison is a 23 y.o. female who presents for a postpartum visit. She is 6 weeks postpartum following a spontaneous vaginal delivery. I have fully reviewed the prenatal and intrapartum course. The delivery was at 46 gestational weeks. Outcome: spontaneous vaginal delivery. Anesthesia: epidural. Postpartum course has been uncmplicated. Baby's course has been uncomplicated. Baby is feeding by formula. Bleeding no bleeding. Bowel function is normal. Bladder function is normal. Patient is not sexually active. Contraception method is oral progesterone-only contraceptive. Postpartum depression screening: negative. Score 1  The following portions of the patient's history were reviewed and updated as appropriate: allergies, current medications, past family history, past medical history, past social history, past surgical history and problem list.  Review of Systems A comprehensive review of systems was negative.   Objective:    BP 121/80   Pulse 88   Ht 5\' 6"  (1.676 m)   Wt 165 lb 11.2 oz (75.2 kg)   LMP 06/02/2018 (Exact Date)   BMI 26.74 kg/m   General:  alert, cooperative and appears stated age   Breasts:  inspection negative, no nipple discharge or bleeding, no masses or nodularity palpable  Lungs: clear to auscultation bilaterally  Heart:  regular rate and rhythm, S1, S2 normal, no murmur, click, rub or gallop  Abdomen: soft, non-tender; bowel sounds normal; no masses,  no organomegaly   Vulva:  normal  Vagina: normal vagina, no discharge, exudate, lesion, or erythema  Cervix:  multiparous appearance  Corpus: normal size, contour, position, consistency, mobility, non-tender  Adnexa:  no mass, fullness, tenderness  Rectal Exam: Not performed.        Assessment:     6 weeks postpartum exam. Pap smear not done at today's visit.   Plan:    1. Contraception: desires Mirena- will call for insertion at onset menses 2. Labs obtained-will follow up accordingly 3.  Follow up in: 3 months or as needed.

## 2019-04-18 NOTE — Patient Instructions (Signed)
  Place postpartum visit patient instructions here.  

## 2019-04-19 LAB — CBC
Hematocrit: 37.4 % (ref 34.0–46.6)
Hemoglobin: 12.6 g/dL (ref 11.1–15.9)
MCH: 28.4 pg (ref 26.6–33.0)
MCHC: 33.7 g/dL (ref 31.5–35.7)
MCV: 84 fL (ref 79–97)
Platelets: 238 10*3/uL (ref 150–450)
RBC: 4.43 x10E6/uL (ref 3.77–5.28)
RDW: 13 % (ref 11.7–15.4)
WBC: 8.8 10*3/uL (ref 3.4–10.8)

## 2019-04-19 LAB — FERRITIN: Ferritin: 42 ng/mL (ref 15–150)

## 2019-04-19 LAB — VITAMIN D 25 HYDROXY (VIT D DEFICIENCY, FRACTURES): Vit D, 25-Hydroxy: 42.5 ng/mL (ref 30.0–100.0)

## 2019-04-29 ENCOUNTER — Other Ambulatory Visit: Payer: Self-pay

## 2019-04-29 MED ORDER — SUMATRIPTAN SUCCINATE 100 MG PO TABS: 100.0000 mg | ORAL_TABLET | Freq: Once | ORAL | 2 refills | Status: DC | PRN

## 2019-05-30 ENCOUNTER — Other Ambulatory Visit: Payer: Self-pay

## 2019-05-30 ENCOUNTER — Ambulatory Visit (INDEPENDENT_AMBULATORY_CARE_PROVIDER_SITE_OTHER): Payer: 59 | Admitting: Certified Nurse Midwife

## 2019-05-30 ENCOUNTER — Encounter: Payer: Self-pay | Admitting: Certified Nurse Midwife

## 2019-05-30 VITALS — BP 113/82 | HR 75 | Ht 66.0 in | Wt 166.0 lb

## 2019-05-30 DIAGNOSIS — Z30430 Encounter for insertion of intrauterine contraceptive device: Secondary | ICD-10-CM | POA: Diagnosis not present

## 2019-05-30 NOTE — Patient Instructions (Signed)
IUD PLACEMENT POST-PROCEDURE INSTRUCTIONS  1. You may take Ibuprofen, Aleve or Tylenol for pain if needed.  Cramping should resolve within in 24 hours.  2. You may have a small amount of spotting.  You should wear a mini pad for the next few days.  3. You may have intercourse after 72 hours.  If you using this for birth control, it is effective immediately.  4. You need to call if you have any pelvic pain, fever, heavy bleeding or foul smelling vaginal discharge.  Irregular bleeding is common the first several months after having an IUD placed. You do not need to call for this reason unless you are concerned.  5. Shower or bathe as normal  You should have a follow-up appointment in 4-8 weeks for a re-check to make sure you are not having any problems.  Levonorgestrel intrauterine device (IUD) What is this medicine? LEVONORGESTREL IUD (LEE voe nor jes trel) is a contraceptive (birth control) device. The device is placed inside the uterus by a healthcare professional. It is used to prevent pregnancy. This device can also be used to treat heavy bleeding that occurs during your period. This medicine may be used for other purposes; ask your health care provider or pharmacist if you have questions. COMMON BRAND NAME(S): Minette Headland What should I tell my health care provider before I take this medicine? They need to know if you have any of these conditions:  abnormal Pap smear  cancer of the breast, uterus, or cervix  diabetes  endometritis  genital or pelvic infection now or in the past  have more than one sexual partner or your partner has more than one partner  heart disease  history of an ectopic or tubal pregnancy  immune system problems  IUD in place  liver disease or tumor  problems with blood clots or take blood-thinners  seizures  use intravenous drugs  uterus of unusual shape  vaginal bleeding that has not been explained  an unusual or  allergic reaction to levonorgestrel, other hormones, silicone, or polyethylene, medicines, foods, dyes, or preservatives  pregnant or trying to get pregnant  breast-feeding How should I use this medicine? This device is placed inside the uterus by a health care professional. Talk to your pediatrician regarding the use of this medicine in children. Special care may be needed. Overdosage: If you think you have taken too much of this medicine contact a poison control center or emergency room at once. NOTE: This medicine is only for you. Do not share this medicine with others. What if I miss a dose? This does not apply. Depending on the brand of device you have inserted, the device will need to be replaced every 3 to 6 years if you wish to continue using this type of birth control. What may interact with this medicine? Do not take this medicine with any of the following medications:  amprenavir  bosentan  fosamprenavir This medicine may also interact with the following medications:  aprepitant  armodafinil  barbiturate medicines for inducing sleep or treating seizures  bexarotene  boceprevir  griseofulvin  medicines to treat seizures like carbamazepine, ethotoin, felbamate, oxcarbazepine, phenytoin, topiramate  modafinil  pioglitazone  rifabutin  rifampin  rifapentine  some medicines to treat HIV infection like atazanavir, efavirenz, indinavir, lopinavir, nelfinavir, tipranavir, ritonavir  St. John's wort  warfarin This list may not describe all possible interactions. Give your health care provider a list of all the medicines, herbs, non-prescription drugs, or dietary supplements  you use. Also tell them if you smoke, drink alcohol, or use illegal drugs. Some items may interact with your medicine. What should I watch for while using this medicine? Visit your doctor or health care professional for regular check ups. See your doctor if you or your partner has sexual  contact with others, becomes HIV positive, or gets a sexual transmitted disease. This product does not protect you against HIV infection (AIDS) or other sexually transmitted diseases. You can check the placement of the IUD yourself by reaching up to the top of your vagina with clean fingers to feel the threads. Do not pull on the threads. It is a good habit to check placement after each menstrual period. Call your doctor right away if you feel more of the IUD than just the threads or if you cannot feel the threads at all. The IUD may come out by itself. You may become pregnant if the device comes out. If you notice that the IUD has come out use a backup birth control method like condoms and call your health care provider. Using tampons will not change the position of the IUD and are okay to use during your period. This IUD can be safely scanned with magnetic resonance imaging (MRI) only under specific conditions. Before you have an MRI, tell your healthcare provider that you have an IUD in place, and which type of IUD you have in place. What side effects may I notice from receiving this medicine? Side effects that you should report to your doctor or health care professional as soon as possible:  allergic reactions like skin rash, itching or hives, swelling of the face, lips, or tongue  fever, flu-like symptoms  genital sores  high blood pressure  no menstrual period for 6 weeks during use  pain, swelling, warmth in the leg  pelvic pain or tenderness  severe or sudden headache  signs of pregnancy  stomach cramping  sudden shortness of breath  trouble with balance, talking, or walking  unusual vaginal bleeding, discharge  yellowing of the eyes or skin Side effects that usually do not require medical attention (report to your doctor or health care professional if they continue or are bothersome):  acne  breast pain  change in sex drive or performance  changes in  weight  cramping, dizziness, or faintness while the device is being inserted  headache  irregular menstrual bleeding within first 3 to 6 months of use  nausea This list may not describe all possible side effects. Call your doctor for medical advice about side effects. You may report side effects to FDA at 1-800-FDA-1088. Where should I keep my medicine? This does not apply. NOTE: This sheet is a summary. It may not cover all possible information. If you have questions about this medicine, talk to your doctor, pharmacist, or health care provider.  2020 Elsevier/Gold Standard (2018-08-20 13:22:01)

## 2019-05-30 NOTE — Progress Notes (Signed)
Stacey Harrison is a 23 y.o. year old [id number] Caucasian female who presents for placement of a Mirena IUD.  BP 113/82   Pulse 75   Ht 5\' 6"  (1.676 m)   Wt 166 lb (75.3 kg)   LMP 05/20/2019 (Exact Date)   Breastfeeding No   BMI 26.79 kg/m   The risks and benefits of the method and placement have been thouroughly reviewed with the patient and all questions were answered.  Specifically the patient is aware of failure rate of 10/998, expulsion of the IUD and of possible perforation.  The patient is aware of irregular bleeding due to the method and understands the incidence of irregular bleeding diminishes with time.  Signed copy of informed consent in chart.   Time out was performed.  A small plastic speculum was placed in the vagina.  The cervix was visualized, prepped using Betadine, and grasped with a single tooth tenaculum. The uterus was sounded to 9 cm.  Mirena IUD placed per manufacturer's recommendations.   The strings were trimmed to 3 cm.  The patient was given post procedure instructions, including signs and symptoms of infection and to check for the strings after each menses or each month, and refraining from intercourse or anything in the vagina for 3 days.  She was given a Mirena care card with date Mirena placed, and date Mirena to be removed.  Reviewed red flag symptoms and when to call.  RTC x 6-8 weeks for IUD string check or sooner if needed.   Diona Fanti, CNM [facility], [facility] 05/30/19 10:14 AM   NDC: 16109-604-54 Lot: [id number] Exp: 07/2021

## 2019-05-30 NOTE — Progress Notes (Signed)
Patient here for Mirena IUD insertion.

## 2019-06-05 ENCOUNTER — Encounter: Payer: Self-pay | Admitting: Certified Nurse Midwife

## 2019-06-24 ENCOUNTER — Telehealth: Payer: Self-pay | Admitting: Certified Nurse Midwife

## 2019-06-24 ENCOUNTER — Ambulatory Visit (INDEPENDENT_AMBULATORY_CARE_PROVIDER_SITE_OTHER): Payer: 59 | Admitting: Certified Nurse Midwife

## 2019-06-24 ENCOUNTER — Other Ambulatory Visit: Payer: Self-pay

## 2019-06-24 ENCOUNTER — Encounter: Payer: Self-pay | Admitting: Certified Nurse Midwife

## 2019-06-24 VITALS — BP 109/78 | HR 108 | Ht 66.0 in | Wt 158.2 lb

## 2019-06-24 DIAGNOSIS — Z30432 Encounter for removal of intrauterine contraceptive device: Secondary | ICD-10-CM | POA: Diagnosis not present

## 2019-06-24 NOTE — Telephone Encounter (Signed)
Called and spoke with patient.  Patient c/o severe anxiety since Mirena IUD insertion 05/30/2019 and request removal ASAP.  Appointment scheduled for today 9/1 at 2:45 pm with JML.  Advised patient to call back if symptoms get worse or to go to ER if needed.  Patient verbalized understanding and voiced that she feels better she will be seen later today.

## 2019-06-24 NOTE — Progress Notes (Signed)
Stacey Harrison is a 23 y.o. year old [id number] Caucasian female who presents for removal of a Mirena IUD. Her Mirena IUD was placed 05/30/2019. Notes increased anxiety since placement.   BP 109/78   Pulse (!) 108   Ht 5\' 6"  (1.676 m)   Wt 158 lb 3.2 oz (71.8 kg)   BMI 25.53 kg/m    GAD 7 : Generalized Anxiety Score 06/24/2019  Nervous, Anxious, on Edge 1  Control/stop worrying 1  Worry too much - different things 1  Trouble relaxing 1  Restless 0  Easily annoyed or irritable 0  Afraid - awful might happen 1  Total GAD 7 Score 5  Anxiety Difficulty Not difficult at all    Depression screen [facility] 2/9 06/24/2019 04/18/2019 04/18/2019  Decreased Interest 0 0 0  Down, Depressed, Hopeless 2 0 0  PHQ - 2 Score 2 0 0  Altered sleeping 0 0 0  Tired, decreased energy 0 1 0  Change in appetite 0 0 0  Feeling bad or failure about yourself  1 0 -  Trouble concentrating 0 0 0  Moving slowly or fidgety/restless 0 0 0  Suicidal thoughts 0 0 0  PHQ-9 Score 3 1 0  Difficult doing work/chores Not difficult at all Not difficult at all -   Time out was performed.  A small plastic speculum was placed in the vagina.  The cervix was visualized, and the strings were visible. They were grasped and the Mirena was easily removed intact without complications.   Samples of Lo loestrin given.   Reviewed red flag symptoms and when to call.   RTC x 6-8 weeks for ANNUAL EXAM and PAP or sooner if needed.    Diona Fanti, CNM [facility], [facility]

## 2019-06-24 NOTE — Patient Instructions (Signed)
Ethinyl Estradiol; Norethindrone Acetate; Ferrous fumarate tablets or capsules What is this medicine? ETHINYL ESTRADIOL; NORETHINDRONE ACETATE; FERROUS FUMARATE (ETH in il es tra DYE ole; nor eth IN drone AS e tate; FER [place] FUE ma rate) is an oral contraceptive. The products combine two types of female hormones, an estrogen and a progestin. They are used to prevent ovulation and pregnancy. Some products are also used to treat acne in females. This medicine may be used for other purposes; ask your health care provider or pharmacist if you have questions. COMMON BRAND NAME(S): Aurovela [address] 1/20, Aurovela Fe, [place] [address], [address] Fe, Estrostep Fe, Gildess 24 Fe, Gildess Fe 1.5/30, Gildess Fe 1/20, Hailey 24 Fe, Junel Fe 1.5/30, Junel Fe 1/20, Junel Fe 24, Larin Fe, Lo Loestrin Fe, Loestrin 24 Fe, Loestrin FE 1.5/30, Loestrin FE 1/20, Lomedia 24 Fe, Microgestin 24 Fe, Microgestin Fe 1.5/30, Microgestin Fe 1/20, Tarina 24 Fe, Tarina Fe 1/20, Tayt[place], Tilia Fe, Tri-Legest Fe What should I tell my health care provider before I take this medicine? They need to know if you have any of these conditions:  abnormal vaginal bleeding  blood vessel disease  breast, cervical, endometrial, ovarian, liver, or uterine cancer  diabetes  gallbladder disease  heart disease or recent heart attack  high blood pressure  high cholesterol  history of blood clots  kidney disease  liver disease  migraine headaches  smoke tobacco  stroke  systemic lupus erythematosus (SLE)  an unusual or allergic reaction to estrogens, progestins, other medicines, foods, dyes, or preservatives  pregnant or trying to get pregnant  breast-feeding How should I use this medicine? Take this medicine by mouth. To reduce nausea, this medicine may be taken with food. Follow the directions on the prescription label. Take this medicine at the same time each day and in the order directed on the package. Do not take your  medicine more often than directed. A patient package insert for the product will be given with each prescription and refill. Read this sheet carefully each time. The sheet may change frequently. Contact your pediatrician regarding the use of this medicine in children. Special care may be needed. This medicine has been used in female children who have started having menstrual periods. Overdosage: If you think you have taken too much of this medicine contact a poison control center or emergency room at once. NOTE: This medicine is only for you. Do not share this medicine with others. What if I miss a dose? If you miss a dose, refer to the patient information sheet you received with your medicine for direction. If you miss more than one pill, this medicine may not be as effective and you may need to use another form of birth control. What may interact with this medicine? Do not take this medicine with the following medication:  dasabuvir; ombitasvir; paritaprevir; ritonavir  ombitasvir; paritaprevir; ritonavir This medicine may also interact with the following medications:  acetaminophen  antibiotics or medicines for infections, especially rifampin, rifabutin, rifapentine, and griseofulvin, and possibly penicillins or tetracyclines  aprepitant  ascorbic acid (vitamin C)  atorvastatin  barbiturate medicines, such as phenobarbital  bosentan  carbamazepine  caffeine  clofibrate  cyclosporine  dantrolene  doxercalciferol  felbamate  grapefruit juice  hydrocortisone  medicines for anxiety or sleeping problems, such as diazepam or temazepam  medicines for diabetes, including pioglitazone  mineral oil  modafinil  mycophenolate  nefazodone  oxcarbazepine  phenytoin  prednisolone  ritonavir or other medicines for HIV infection or  AIDS °· rosuvastatin °· selegiline °· soy isoflavones supplements °· St. John's wort °· tamoxifen or  raloxifene °· theophylline °· thyroid hormones °· topiramate °· warfarin °This list may not describe all possible interactions. Give your health care provider a list of all the medicines, herbs, non-prescription drugs, or dietary supplements you use. Also tell them if you smoke, drink alcohol, or use illegal drugs. Some items may interact with your medicine. °What should I watch for while using this medicine? °Visit your doctor or health care professional for regular checks on your progress. You will need a regular breast and pelvic exam and Pap smear while on this medicine. °Use an additional method of contraception during the first cycle that you take these tablets. °If you have any reason to think you are pregnant, stop taking this medicine right away and contact your doctor or health care professional. °If you are taking this medicine for hormone related problems, it may take several cycles of use to see improvement in your condition. °Smoking increases the risk of getting a blood clot or having a stroke while you are taking birth control pills, especially if you are more than 25 years old. You are strongly advised not to smoke. °This medicine can make your body retain fluid, making your fingers, hands, or ankles swell. Your blood pressure can go up. Contact your doctor or health care professional if you feel you are retaining fluid. °This medicine can make you more sensitive to the sun. Keep out of the sun. If you cannot avoid being in the sun, wear protective clothing and use sunscreen. Do not use sun lamps or tanning beds/booths. °If you wear contact lenses and notice visual changes, or if the lenses begin to feel uncomfortable, consult your eye care specialist. °In some women, tenderness, swelling, or minor bleeding of the gums may occur. Notify your dentist if this happens. Brushing and flossing your teeth regularly may help limit this. See your dentist regularly and inform your dentist of the medicines you  are taking. °If you are going to have elective surgery, you may need to stop taking this medicine before the surgery. Consult your health care professional for advice. °This medicine does not protect you against HIV infection (AIDS) or any other sexually transmitted diseases. °What side effects may I notice from receiving this medicine? °Side effects that you should report to your doctor or health care professional as soon as possible: °· allergic reactions like skin rash, itching or hives, swelling of the face, lips, or tongue °· breast tissue changes or discharge °· changes in vaginal bleeding during your period or between your periods °· changes in vision °· chest pain °· confusion °· coughing up blood °· dizziness °· feeling faint or lightheaded °· headaches or migraines °· leg, arm or groin pain °· loss of balance or coordination °· severe or sudden headaches °· stomach pain (severe) °· sudden shortness of breath °· sudden numbness or weakness of the face, arm or leg °· symptoms of vaginal infection like itching, irritation or unusual discharge °· tenderness in the upper abdomen °· trouble speaking or understanding °· vomiting °· yellowing of the eyes or skin °Side effects that usually do not require medical attention (report to your doctor or health care professional if they continue or are bothersome): °· breakthrough bleeding and spotting that continues beyond the 3 initial cycles of pills °· breast tenderness °· mood changes, anxiety, depression, frustration, anger, or emotional outbursts °· increased sensitivity to sun or ultraviolet light °·   nausea °· skin rash, acne, or brown spots on the skin °· weight gain (slight) °This list may not describe all possible side effects. Call your doctor for medical advice about side effects. You may report side effects to FDA at 1-800-FDA-1088. °Where should I keep my medicine? °Keep out of the reach of children. °Store at room temperature between 15 and 30 degrees C  (59 and 86 degrees F). Throw away any unused medicine after the expiration date. °NOTE: This sheet is a summary. It may not cover all possible information. If you have questions about this medicine, talk to your doctor, pharmacist, or health care provider. °© 2020 Elsevier/Gold Standard (2016-06-19 08:04:41) °Preventive Care 21-39 Years Old, Female °Preventive care refers to visits with your health care provider and lifestyle choices that can promote health and wellness. This includes: °· A yearly physical exam. This may also be called an annual well check. °· Regular dental visits and eye exams. °· Immunizations. °· Screening for certain conditions. °· Healthy lifestyle choices, such as eating a healthy diet, getting regular exercise, not using drugs or products that contain nicotine and tobacco, and limiting alcohol use. °What can I expect for my preventive care visit? °Physical exam °Your health care provider will check your: °· Height and weight. This may be used to calculate body mass index (BMI), which tells if you are at a healthy weight. °· Heart rate and blood pressure. °· Skin for abnormal spots. °Counseling °Your health care provider may ask you questions about your: °· Alcohol, tobacco, and drug use. °· Emotional well-being. °· Home and relationship well-being. °· Sexual activity. °· Eating habits. °· Work and work environment. °· Method of birth control. °· Menstrual cycle. °· Pregnancy history. °What immunizations do I need? ° °Influenza (flu) vaccine °· This is recommended every year. °Tetanus, diphtheria, and pertussis (Tdap) vaccine °· You may need a Td booster every 10 years. °Varicella (chickenpox) vaccine °· You may need this if you have not been vaccinated. °Human papillomavirus (HPV) vaccine °· If recommended by your health care provider, you may need three doses over 6 months. °Measles, mumps, and rubella (MMR) vaccine °· You may need at least one dose of MMR. You may also need a second  dose. °Meningococcal conjugate (MenACWY) vaccine °· One dose is recommended if you are age 19-21 years and a first-year college student living in a residence hall, or if you have one of several medical conditions. You may also need additional booster doses. °Pneumococcal conjugate (PCV13) vaccine °· You may need this if you have certain conditions and were not previously vaccinated. °Pneumococcal polysaccharide (PPSV23) vaccine °· You may need one or two doses if you smoke cigarettes or if you have certain conditions. °Hepatitis A vaccine °· You may need this if you have certain conditions or if you travel or work in places where you may be exposed to hepatitis A. °Hepatitis B vaccine °· You may need this if you have certain conditions or if you travel or work in places where you may be exposed to hepatitis B. °Haemophilus influenzae type b (Hib) vaccine °· You may need this if you have certain conditions. °You may receive vaccines as individual doses or as more than one vaccine together in one shot (combination vaccines). Talk with your health care provider about the risks and benefits of combination vaccines. °What tests do I need? ° °Blood tests °· Lipid and cholesterol levels. These may be checked every 5 years starting at age 20. °· Hepatitis   C test. °· Hepatitis B test. °Screening °· Diabetes screening. This is done by checking your blood sugar (glucose) after you have not eaten for a while (fasting). °· Sexually transmitted disease (STD) testing. °· BRCA-related cancer screening. This may be done if you have a family history of breast, ovarian, tubal, or peritoneal cancers. °· Pelvic exam and Pap test. This may be done every 3 years starting at age 21. Starting at age 30, this may be done every 5 years if you have a Pap test in combination with an HPV test. °Talk with your health care provider about your test results, treatment options, and if necessary, the need for more tests. °Follow these instructions at  home: °Eating and drinking ° °· Eat a diet that includes fresh fruits and vegetables, whole grains, lean protein, and low-fat dairy. °· Take vitamin and mineral supplements as recommended by your health care provider. °· Do not drink alcohol if: °? Your health care provider tells you not to drink. °? You are pregnant, may be pregnant, or are planning to become pregnant. °· If you drink alcohol: °? Limit how much you have to 0-1 drink a day. °? Be aware of how much alcohol is in your drink. In the [place] one drink equals one 12 oz bottle of beer (355 mL), one 5 oz glass of wine (148 mL), or one 1½ oz glass of hard liquor (44 mL). °Lifestyle °· Take daily care of your teeth and gums. °· Stay active. Exercise for at least 30 minutes on 5 or more days each week. °· Do not use any products that contain nicotine or tobacco, such as cigarettes, e-cigarettes, and chewing tobacco. If you need help quitting, ask your health care provider. °· If you are sexually active, practice safe sex. Use a condom or other form of birth control (contraception) in order to prevent pregnancy and STIs (sexually transmitted infections). If you plan to become pregnant, see your health care provider for a preconception visit. °What's next? °· Visit your health care provider once a year for a well check visit. °· Ask your health care provider how often you should have your eyes and teeth checked. °· Stay up to date on all vaccines. °This information is not intended to replace advice given to you by your health care provider. Make sure you discuss any questions you have with your health care provider. °Document Released: 12/05/2001 Document Revised: 06/20/2018 Document Reviewed: 06/20/2018 °Elsevier Patient Education © 2020 Elsevier Inc. ° °

## 2019-06-24 NOTE — Telephone Encounter (Signed)
Pt called requesting to be seen today due to bad anxiety. The patient stated that her anxiety is so bad today it has made her sick a few times this morning. Pt requesting a call back. Please advise.

## 2019-06-24 NOTE — Progress Notes (Signed)
Patient c/o severe anxiety since Mirena IUD insertion 8/7, desires removal of IUD.

## 2019-06-26 ENCOUNTER — Encounter: Payer: Self-pay | Admitting: Certified Nurse Midwife

## 2019-06-26 ENCOUNTER — Other Ambulatory Visit: Payer: Self-pay

## 2019-06-26 MED ORDER — KETOROLAC TROMETHAMINE 10 MG PO TABS: 10.0000 mg | ORAL_TABLET | Freq: Four times a day (QID) | ORAL | 0 refills | Status: DC | PRN

## 2019-06-26 MED ORDER — ONDANSETRON 4 MG PO TBDP: 4.0000 mg | ORAL_TABLET | Freq: Three times a day (TID) | ORAL | 0 refills | Status: DC | PRN

## 2019-07-05 ENCOUNTER — Encounter: Payer: Self-pay | Admitting: Certified Nurse Midwife

## 2019-07-11 ENCOUNTER — Other Ambulatory Visit: Payer: Self-pay | Admitting: Family Medicine

## 2019-07-11 ENCOUNTER — Other Ambulatory Visit: Payer: Self-pay

## 2019-07-11 ENCOUNTER — Encounter: Payer: Self-pay | Admitting: Certified Nurse Midwife

## 2019-07-11 DIAGNOSIS — R202 Paresthesia of skin: Secondary | ICD-10-CM

## 2019-07-11 MED ORDER — ONDANSETRON 4 MG PO TBDP: 4.0000 mg | ORAL_TABLET | Freq: Three times a day (TID) | ORAL | 0 refills | Status: DC | PRN

## 2019-07-12 ENCOUNTER — Ambulatory Visit
Admission: RE | Admit: 2019-07-12 | Discharge: 2019-07-12 | Disposition: A | Discharge status: Home / Self Care | Payer: 59 | Source: Ambulatory Visit | Attending: Family Medicine | Admitting: Family Medicine

## 2019-07-12 ENCOUNTER — Other Ambulatory Visit: Payer: Self-pay

## 2019-07-12 DIAGNOSIS — R202 Paresthesia of skin: Secondary | ICD-10-CM | POA: Insufficient documentation

## 2019-07-12 MED ORDER — GADOBUTROL 1 MMOL/ML IV SOLN
7.0000 mL | Freq: Once | INTRAVENOUS | Status: AC | PRN
  Administered 2019-07-12: 7 mL via INTRAVENOUS

## 2019-07-18 ENCOUNTER — Encounter: Payer: 59 | Admitting: Certified Nurse Midwife

## 2019-08-21 ENCOUNTER — Encounter: Payer: Self-pay | Admitting: Certified Nurse Midwife

## 2019-08-21 ENCOUNTER — Other Ambulatory Visit: Payer: Self-pay

## 2019-08-21 ENCOUNTER — Other Ambulatory Visit (HOSPITAL_COMMUNITY)
Admission: RE | Admit: 2019-08-21 | Discharge: 2019-08-21 | Disposition: A | Discharge status: Home / Self Care | Payer: 59 | Source: Ambulatory Visit | Attending: Certified Nurse Midwife | Admitting: Certified Nurse Midwife

## 2019-08-21 ENCOUNTER — Ambulatory Visit (INDEPENDENT_AMBULATORY_CARE_PROVIDER_SITE_OTHER): Payer: 59 | Admitting: Certified Nurse Midwife

## 2019-08-21 VITALS — BP 113/82 | HR 86 | Ht 66.0 in | Wt 147.8 lb

## 2019-08-21 DIAGNOSIS — Z1331 Encounter for screening for depression: Secondary | ICD-10-CM

## 2019-08-21 DIAGNOSIS — Z124 Encounter for screening for malignant neoplasm of cervix: Secondary | ICD-10-CM | POA: Diagnosis not present

## 2019-08-21 DIAGNOSIS — Z01419 Encounter for gynecological examination (general) (routine) without abnormal findings: Secondary | ICD-10-CM

## 2019-08-21 NOTE — Progress Notes (Signed)
Patient here for annual exam. No complaints.  

## 2019-08-21 NOTE — Patient Instructions (Signed)
Preventive Care 21-25 Years Old, Female Preventive care refers to visits with your health care provider and lifestyle choices that can promote health and wellness. This includes:  A yearly physical exam. This may also be called an annual well check.  Regular dental visits and eye exams.  Immunizations.  Screening for certain conditions.  Healthy lifestyle choices, such as eating a healthy diet, getting regular exercise, not using drugs or products that contain nicotine and tobacco, and limiting alcohol use. What can I expect for my preventive care visit? Physical exam Your health care provider will check your:  Height and weight. This may be used to calculate body mass index (BMI), which tells if you are at a healthy weight.  Heart rate and blood pressure.  Skin for abnormal spots. Counseling Your health care provider may ask you questions about your:  Alcohol, tobacco, and drug use.  Emotional well-being.  Home and relationship well-being.  Sexual activity.  Eating habits.  Work and work environment.  Method of birth control.  Menstrual cycle.  Pregnancy history. What immunizations do I need?  Influenza (flu) vaccine  This is recommended every year. Tetanus, diphtheria, and pertussis (Tdap) vaccine  You may need a Td booster every 10 years. Varicella (chickenpox) vaccine  You may need this if you have not been vaccinated. Human papillomavirus (HPV) vaccine  If recommended by your health care provider, you may need three doses over 6 months. Measles, mumps, and rubella (MMR) vaccine  You may need at least one dose of MMR. You may also need a second dose. Meningococcal conjugate (MenACWY) vaccine  One dose is recommended if you are age 19-21 years and a first-year college student living in a residence hall, or if you have one of several medical conditions. You may also need additional booster doses. Pneumococcal conjugate (PCV13) vaccine  You may need  this if you have certain conditions and were not previously vaccinated. Pneumococcal polysaccharide (PPSV23) vaccine  You may need one or two doses if you smoke cigarettes or if you have certain conditions. Hepatitis A vaccine  You may need this if you have certain conditions or if you travel or work in places where you may be exposed to hepatitis A. Hepatitis B vaccine  You may need this if you have certain conditions or if you travel or work in places where you may be exposed to hepatitis B. Haemophilus influenzae type b (Hib) vaccine  You may need this if you have certain conditions. You may receive vaccines as individual doses or as more than one vaccine together in one shot (combination vaccines). Talk with your health care provider about the risks and benefits of combination vaccines. What tests do I need?  Blood tests  Lipid and cholesterol levels. These may be checked every 5 years starting at age 20.  Hepatitis C test.  Hepatitis B test. Screening  Diabetes screening. This is done by checking your blood sugar (glucose) after you have not eaten for a while (fasting).  Sexually transmitted disease (STD) testing.  BRCA-related cancer screening. This may be done if you have a family history of breast, ovarian, tubal, or peritoneal cancers.  Pelvic exam and Pap test. This may be done every 3 years starting at age 21. Starting at age 30, this may be done every 5 years if you have a Pap test in combination with an HPV test. Talk with your health care provider about your test results, treatment options, and if necessary, the need for more tests.   Follow these instructions at home: Eating and drinking   Eat a diet that includes fresh fruits and vegetables, whole grains, lean protein, and low-fat dairy.  Take vitamin and mineral supplements as recommended by your health care provider.  Do not drink alcohol if: ? Your health care provider tells you not to drink. ? You are  pregnant, may be pregnant, or are planning to become pregnant.  If you drink alcohol: ? Limit how much you have to 0-1 drink a day. ? Be aware of how much alcohol is in your drink. In the [place] one drink equals one 12 oz bottle of beer (355 mL), one 5 oz glass of wine (148 mL), or one 1 oz glass of hard liquor (44 mL). Lifestyle  Take daily care of your teeth and gums.  Stay active. Exercise for at least 30 minutes on 5 or more days each week.  Do not use any products that contain nicotine or tobacco, such as cigarettes, e-cigarettes, and chewing tobacco. If you need help quitting, ask your health care provider.  If you are sexually active, practice safe sex. Use a condom or other form of birth control (contraception) in order to prevent pregnancy and STIs (sexually transmitted infections). If you plan to become pregnant, see your health care provider for a preconception visit. What's next?  Visit your health care provider once a year for a well check visit.  Ask your health care provider how often you should have your eyes and teeth checked.  Stay up to date on all vaccines. This information is not intended to replace advice given to you by your health care provider. Make sure you discuss any questions you have with your health care provider. Document Released: 12/05/2001 Document Revised: 06/20/2018 Document Reviewed: 06/20/2018 Elsevier Patient Education  2020 Elsevier Inc.  

## 2019-08-21 NOTE — Progress Notes (Signed)
ANNUAL PREVENTATIVE CARE GYN  ENCOUNTER NOTE  Subjective:       Stacey Harrison is a 23 y.o. G14P1001 female here for a routine annual gynecologic exam.  Current complaints: 1. Needs Pap smear  Denies difficulty breathing or respiratory distress, chest pain, abdominal pain, excessive vaginal bleeding, dysuria, and leg pain or swelling.    Gynecologic History  Patient's last menstrual period was 07/21/2019 (exact date). Period Cycle (Days): 28 Period Duration (Days): 4 Period Pattern: Regular Menstrual Flow: Moderate Menstrual Control: Tampon Dysmenorrhea: (!) Mild Dysmenorrhea Symptoms: Cramping  Contraception: condoms  Last Pap: due.   Obstetric History  OB History  Gravida Para Term Preterm AB Living  1 1 1  0 0 1  SAB TAB Ectopic Multiple Live Births  0 0 0 0 1    # Outcome Date GA Lbr Len/2nd Weight Sex Delivery Anes PTL Lv  1 Term 03/08/19 [redacted]w[redacted]d / 01:46 6 lb 11.2 oz (3.04 kg) F Vag-Spont EPI N LIV    Past Medical History:  Diagnosis Date  . Migraine     Past Surgical History:  Procedure Laterality Date  . WISDOM TOOTH EXTRACTION  2014    Current Outpatient Medications on File Prior to Visit  Medication Sig Dispense Refill  . Multiple Vitamin (MULTI-VITAMIN) tablet Take 1 tablet by mouth daily.    . SUMAtriptan (IMITREX) 100 MG tablet Take 1 tablet (100 mg total) by mouth once as needed for up to 1 dose for migraine. May repeat in 2 hours if headache persists or recurs. 20 tablet 2   No current facility-administered medications on file prior to visit.     Allergies  Allergen Reactions  . Hydrocodone Itching    Social History   Socioeconomic History  . Marital status: Married    Spouse name: Not on file  . Number of children: Not on file  . Years of education: Not on file  . Highest education level: Not on file  Occupational History  . Not on file  Social Needs  . Financial resource strain: Not on file  . Food insecurity    Worry: Not on  file    Inability: Not on file  . Transportation needs    Medical: Not on file    Non-medical: Not on file  Tobacco Use  . Smoking status: Never Smoker  . Smokeless tobacco: Never Used  Substance and Sexual Activity  . Alcohol use: Yes    Comment: rarely  . Drug use: No  . Sexual activity: Yes    Birth control/protection: Condom  Lifestyle  . Physical activity    Days per week: 3 days    Minutes per session: 30 min  . Stress: Not on file  Relationships  . Social Herbalist on phone: Not on file    Gets together: Not on file    Attends religious service: Not on file    Active member of club or organization: Not on file    Attends meetings of clubs or organizations: Not on file    Relationship status: Not on file  . Intimate partner violence    Fear of current or ex partner: Not on file    Emotionally abused: Not on file    Physically abused: Not on file    Forced sexual activity: Not on file  Other Topics Concern  . Not on file  Social History Narrative  . Not on file    Family History  Problem Relation Age of  Onset  . Breast cancer Maternal Grandmother   . Ovarian cancer Neg Hx   . Colon cancer Neg Hx   . Diabetes Neg Hx     The following portions of the patient's history were reviewed and updated as appropriate: allergies, current medications, past family history, past medical history, past social history, past surgical history and problem list.  Review of Systems  ROS negative except as noted above. Information obtained from patient.    Objective:   BP 113/82   Pulse 86   Ht 5\' 6"  (1.676 m)   Wt 147 lb 12.8 oz (67 kg)   LMP 07/21/2019 (Exact Date)   BMI 23.86 kg/m   CONSTITUTIONAL: Well-developed, well-nourished female in no acute distress.   PSYCHIATRIC: Normal mood and affect. Normal behavior. Normal judgment and thought content.  [place]: Alert and oriented to person, place, and time. Normal muscle tone coordination. No cranial nerve  deficit noted.  HENT:  Normocephalic, atraumatic, External right and left ear normal.   EYES: Conjunctivae and EOM are normal. Pupils are equal and round.   NECK: Normal range of motion, supple, no masses.  Normal thyroid.   SKIN: Skin is warm and dry. No rash noted. Not diaphoretic. No erythema. No pallor.  CARDIOVASCULAR: Normal heart rate noted, regular rhythm, no murmur.  RESPIRATORY: Clear to auscultation bilaterally. Effort and breath sounds normal, no problems with respiration noted.  BREASTS: Symmetric in size. No masses, skin changes, nipple drainage, or lymphadenopathy.  ABDOMEN: Soft, normal bowel sounds, no distention noted.  No tenderness, rebound or guarding.   PELVIC:  External Genitalia: Normal  BUS: Normal  Vagina: Normal  Cervix: Normal  Uterus: Normal  Adnexa: Normal   MUSCULOSKELETAL: Normal range of motion. No tenderness.  No cyanosis, clubbing, or edema.  2+ distal pulses.  LYMPHATIC: No Axillary, Supraclavicular, or Inguinal Adenopathy.  GAD 7 : Generalized Anxiety Score 08/21/2019 06/24/2019  Nervous, Anxious, on Edge 1 1  Control/stop worrying 1 1  Worry too much - different things 1 1  Trouble relaxing 0 1  Restless 0 0  Easily annoyed or irritable 0 0  Afraid - awful might happen 0 1  Total GAD 7 Score 3 5  Anxiety Difficulty - Not difficult at all    Depression screen [facility] 2/9 08/21/2019 06/24/2019 04/18/2019 04/18/2019  Decreased Interest 0 0 0 0  Down, Depressed, Hopeless 1 2 0 0  PHQ - 2 Score 1 2 0 0  Altered sleeping 0 0 0 0  Tired, decreased energy 0 0 1 0  Change in appetite 0 0 0 0  Feeling bad or failure about yourself  0 1 0 -  Trouble concentrating 0 0 0 0  Moving slowly or fidgety/restless 0 0 0 0  Suicidal thoughts 0 0 0 0  PHQ-9 Score 1 3 1  0  Difficult doing work/chores Not difficult at all Not difficult at all Not difficult at all -    Assessment:   Annual gynecologic examination 23 y.o.   Contraception: condoms    Normal BMI   Problem List Items Addressed This Visit    None    Visit Diagnoses    Well woman exam    -  Primary   Screening for cervical cancer       Relevant Orders   Cytology - PAP   Depression screen          Plan:   Pap: Pap, Reflex if ASCUS  Labs: Declined   Routine preventative health  maintenance measures emphasized: Exercise/Diet/Weight control, Tobacco Warnings, Alcohol/Substance use risks, Stress Management and Peer Pressure Issues; see AVS  Reviewed red flag symptoms and when to call  RTC x 1 year for ANNUAL EXAM or sooner if needed   Diona Fanti, CNM [facility], [facility] 08/21/19 3:22 PM

## 2019-08-26 ENCOUNTER — Telehealth: Payer: Self-pay | Admitting: Certified Nurse Midwife

## 2019-08-26 NOTE — Telephone Encounter (Signed)
The patient called to check the status of her results. The patient is requesting a call back. Please advise.

## 2019-08-26 NOTE — Telephone Encounter (Signed)
Called and spoke with patient, she is requesting 10/29 pap smear results.  Advised patient results are still in process and we will notify her when we get them.  Patient verbalized understanding.

## 2019-08-27 LAB — CYTOLOGY - PAP: Diagnosis: NEGATIVE

## 2019-08-30 ENCOUNTER — Other Ambulatory Visit: Payer: Self-pay | Admitting: Obstetrics and Gynecology

## 2019-09-04 ENCOUNTER — Encounter: Payer: Self-pay | Admitting: Certified Nurse Midwife

## 2019-09-09 ENCOUNTER — Ambulatory Visit (INDEPENDENT_AMBULATORY_CARE_PROVIDER_SITE_OTHER): Payer: 59 | Admitting: Certified Nurse Midwife

## 2019-09-09 ENCOUNTER — Other Ambulatory Visit: Payer: Self-pay

## 2019-09-09 ENCOUNTER — Encounter: Payer: Self-pay | Admitting: Certified Nurse Midwife

## 2019-09-09 VITALS — BP 129/84 | HR 97 | Ht 66.0 in | Wt 148.5 lb

## 2019-09-09 DIAGNOSIS — F419 Anxiety disorder, unspecified: Secondary | ICD-10-CM

## 2019-09-09 DIAGNOSIS — Z23 Encounter for immunization: Secondary | ICD-10-CM

## 2019-09-09 MED ORDER — SERTRALINE HCL 50 MG PO TABS: 50.0000 mg | ORAL_TABLET | Freq: Every day | ORAL | 0 refills | Status: DC

## 2019-09-09 NOTE — Patient Instructions (Signed)

## 2019-09-09 NOTE — Progress Notes (Signed)
Depression Patient complains of depression/ anxiety 6 months postpartum she state she has had a very difficult time with Pandemic.She finds her self worrying about the kind of mother that she is comparing herself to her mother and her sister She states it started after getting her IUD placed. She felt like the hormones made out of control. She "couldn't stop her thoughts". She had IUD removed which she felt made things better but recently the symptoms have been returning.  She complains of difficulty concentrating, hopelessness and obsessive thoughts, made herself sick . Onset was approximately a few months ago. Symptoms have been gradually worsening since that time. Current symptoms include: difficulty concentrating, fatigue, hopelessness and worry. Patient denies recurrent thoughts of death, suicidal attempt, weight gain and weight loss. Family history significant for anxiety and depression. Possible organic causes contributing are: new baby , sleep deprevation . Risk factors: none. Previous treatment includes none. Discussed use of medication and therapy for treatment. Exercise encouraged. She walks 4 x wkls for 30 min which she finds helpful.   Reviewed SSRI and effective blood levels over the next 4-6 wks. Referral placed for therapy. She denies desire to hurt herself or her baby. She agrees to call 911 or go to ED if thoughts occur. Orders placed for Zoloft 50 mg , she will increase to 100 mg in 2 wks if not feeling any better. Follow up 4 wks for medication check.   I attest more than 50% of visit spent reviewing history, discussing current symptoms , depression, and anxiety, discussing treatment options and developing a plan of care. Face to face time 15 min.   Philip Aspen, CNM

## 2019-09-12 ENCOUNTER — Telehealth: Payer: Self-pay | Admitting: Certified Nurse Midwife

## 2019-09-12 ENCOUNTER — Telehealth: Payer: Self-pay

## 2019-09-12 NOTE — Telephone Encounter (Signed)
The patient called the office again to try to speak with a nurse, Pt is requesting a call back. Please advise.

## 2019-09-12 NOTE — Telephone Encounter (Signed)
Pt called requesting to speak with Deneise Lever or her nurse with some questions about generic Zoloft medication she is taking.

## 2019-09-23 ENCOUNTER — Other Ambulatory Visit: Payer: Self-pay

## 2019-09-23 MED ORDER — SERTRALINE HCL 50 MG PO TABS: 50.0000 mg | ORAL_TABLET | Freq: Every day | ORAL | 0 refills | Status: DC

## 2019-09-23 NOTE — Telephone Encounter (Signed)
Refill on zoloft sent to preferred pharmacy per patient mychart request.

## 2019-09-29 ENCOUNTER — Other Ambulatory Visit: Payer: Self-pay

## 2019-09-29 MED ORDER — SERTRALINE HCL 50 MG PO TABS: 75.0000 mg | ORAL_TABLET | Freq: Every day | ORAL | 0 refills | Status: DC

## 2019-10-10 ENCOUNTER — Other Ambulatory Visit: Payer: Self-pay

## 2019-10-10 ENCOUNTER — Ambulatory Visit (INDEPENDENT_AMBULATORY_CARE_PROVIDER_SITE_OTHER): Payer: 59 | Admitting: Certified Nurse Midwife

## 2019-10-10 ENCOUNTER — Encounter: Payer: Self-pay | Admitting: Certified Nurse Midwife

## 2019-10-10 VITALS — BP 130/81 | HR 85 | Ht 66.0 in | Wt 146.6 lb

## 2019-10-10 DIAGNOSIS — Z79899 Other long term (current) drug therapy: Secondary | ICD-10-CM | POA: Diagnosis not present

## 2019-10-10 DIAGNOSIS — F419 Anxiety disorder, unspecified: Secondary | ICD-10-CM | POA: Insufficient documentation

## 2019-10-10 MED ORDER — SERTRALINE HCL 50 MG PO TABS: 75.0000 mg | ORAL_TABLET | Freq: Every day | ORAL | 5 refills | Status: DC

## 2019-10-10 NOTE — Progress Notes (Signed)
  Medication Management Clinic Visit Note  Patient: Stacey Harrison MRN: [id number] Date of Birth: 08/04/1996 PCP: Kirk Ruths, MD   Eulah Pont 23 y.o. female presents for follow up since starting zoloft a month ago.   There were no vitals taken for this visit.  Patient Information   Past Medical History:  Diagnosis Date  . Migraine       Past Surgical History:  Procedure Laterality Date  . WISDOM TOOTH EXTRACTION  2014     Family History  Problem Relation Age of Onset  . Breast cancer Maternal Grandmother   . Ovarian cancer Neg Hx   . Colon cancer Neg Hx   . Diabetes Neg Hx      Social History   Substance and Sexual Activity  Alcohol Use Yes   Comment: rarely   Social History   Tobacco Use  Smoking Status Never Smoker  Smokeless Tobacco Never Used   Health Maintenance  Topic Date Due  . PAP-Cervical Cytology Screening  08/20/2022  . PAP SMEAR-Modifier  08/20/2022  . TETANUS/TDAP  12/20/2028  . INFLUENZA VACCINE  Completed  . HIV Screening  Completed   Depression screen [facility] 2/9 10/10/2019 08/21/2019 06/24/2019 04/18/2019 04/18/2019  Decreased Interest 0 0 0 0 0  Down, Depressed, Hopeless 0 1 2 0 0  PHQ - 2 Score 0 1 2 0 0  Altered sleeping 0 0 0 0 0  Tired, decreased energy 0 0 0 1 0  Change in appetite 0 0 0 0 0  Feeling bad or failure about yourself  0 0 1 0 -  Trouble concentrating 0 0 0 0 0  Moving slowly or fidgety/restless 0 0 0 0 0  Suicidal thoughts 0 0 0 0 0  PHQ-9 Score 0 1 3 1  0  Difficult doing work/chores - Not difficult at all Not difficult at all Not difficult at all -   GAD 7 : Generalized Anxiety Score 10/10/2019 08/21/2019 06/24/2019  Nervous, Anxious, on Edge 1 1 1   Control/stop worrying 0 1 1  Worry too much - different things 1 1 1   Trouble relaxing 0 0 1  Restless 0 0 0  Easily annoyed or irritable 0 0 0  Afraid - awful might happen 0 0 1  Total GAD 7 Score 2 3 5   Anxiety Difficulty Not difficult at all - Not  difficult at all     Assessment and Plan:  Pt increased her dose of zoloft by 25 mg, she is taking 75 mg daily. She states she is feeling so much better. The obsessive thoughts are "tremendously" better, she is no longer comparing her self to others. She state that by the time they reached out to her for counsoling appointment she was feeling better so she declined. Discuss continuing medication x 6 months then returning for discussion/evaluation to discontinue. She verbalizes and agrees to plan of care.   I attest more than 50% of visit spent reviewing history, discussing current mood , thoughts , eating, sleeping/exrcise habits, discussing coping mechanisms , and support systems. Face to face time 10 min.   Philip Aspen, CNM

## 2019-10-10 NOTE — Patient Instructions (Signed)

## 2019-10-10 NOTE — Progress Notes (Signed)
Patient here to follow-up after starting Zoloft, feeling better.

## 2019-11-13 ENCOUNTER — Other Ambulatory Visit: Payer: Self-pay | Admitting: Certified Nurse Midwife

## 2019-11-15 IMAGING — MR MR HEAD WO/W CM
15 series · 48 of 48 positions shown · IV contrast (gadavist)
Comparison: 09/28/2017

CLINICAL DATA: Right-sided numbness initially beginning in the arm
and extending to involve the leg and face with symptoms now
improved.

EXAM:
MRI HEAD WITHOUT AND WITH CONTRAST
TECHNIQUE: Multiplanar, multiecho pulse sequences of the brain and surrounding
structures were obtained without and with intravenous contrast.
CONTRAST:  7mL GADAVIST GADOBUTROL 1 MMOL/ML IV SOLN

[Series 5: ax dwi_tracew · axial · 3.0mm · 0.60mm/px · z∈[-148,+6]mm · 3 of 48 slices shown]
[im 1/48]
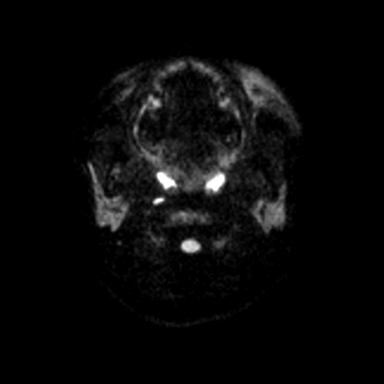
[im 24/48]
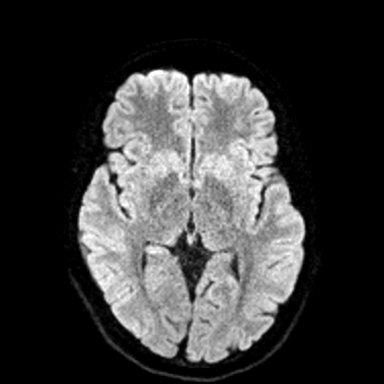
[im 48/48]
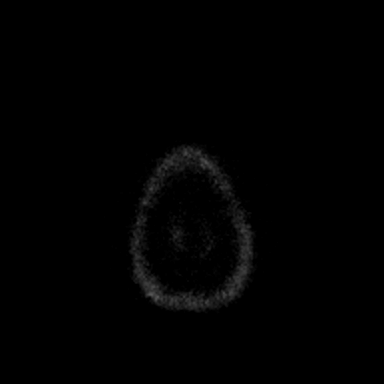

[Series 6: ax dwi_adc · axial · 3.0mm · 0.60mm/px · z∈[-148,+6]mm · 2 of 48 slices shown]
[im 1/48]
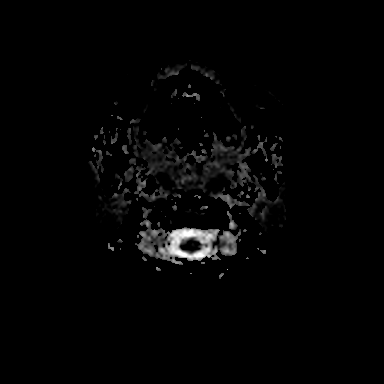
[im 48/48]
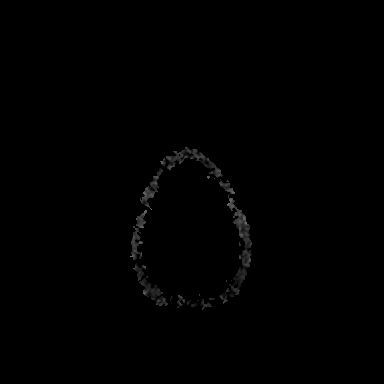

[Series 7: cor dwi_tracew · coronal · 5.0mm · 0.60mm/px · 2 of 40 slices shown]
[im 1/40]
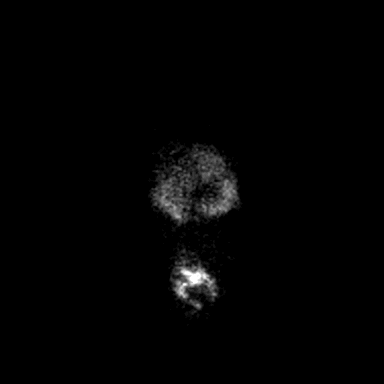
[im 40/40]
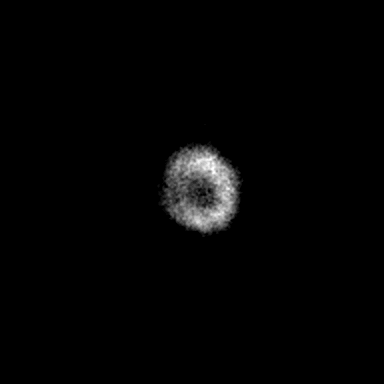

[Series 8: cor dwi_adc · coronal · 5.0mm · 0.60mm/px · 2 of 39 slices shown]
[im 1/39]
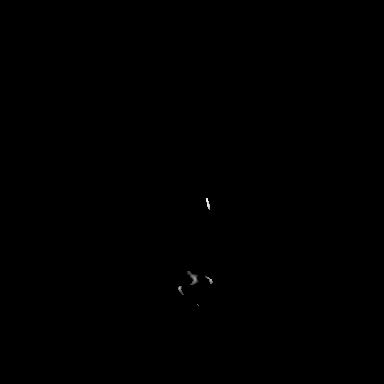
[im 39/39]
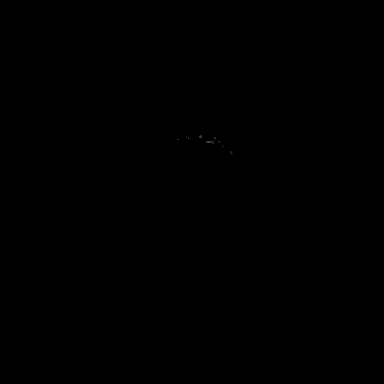

[Series 9: T1 · sagittal · 5.0mm · 0.62mm/px · 1 of 25 slices shown (1 of 2)]
[im 1/25]
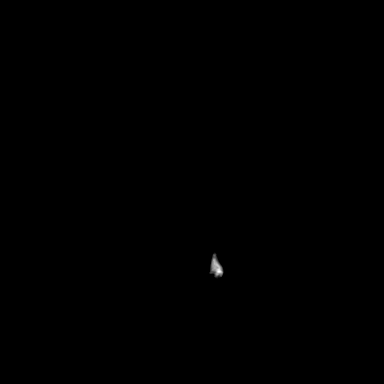

[Series 10: T2 · axial · 5.0mm · 0.53mm/px · 1 of 27 slices shown]
[im 1/27]
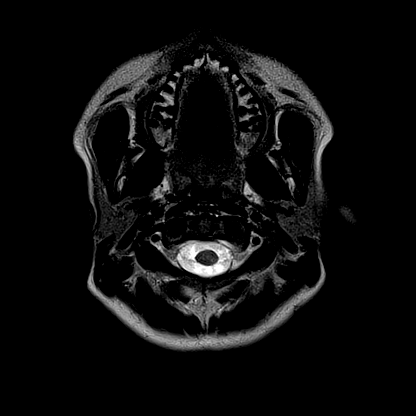

[Series 11: mag_images · axial · 3.0mm · 0.90mm/px · z∈[-157,+19]mm · 3 of 60 slices shown]
[im 1/60]
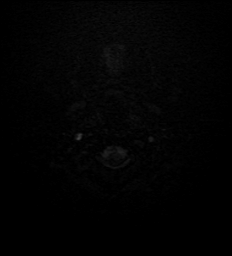
[im 30/60]
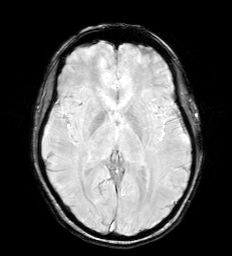
[im 60/60]
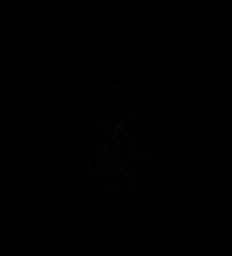

[Series 12: pha_images · axial · 3.0mm · 0.90mm/px · z∈[-157,+10]mm · 3 of 57 slices shown]
[im 1/57]
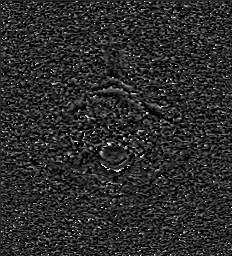
[im 29/57]
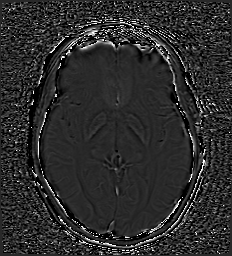
[im 57/57]
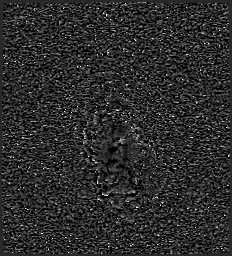

[Series 13: swi_images · axial · 3.0mm · 0.90mm/px · z∈[-157,+19]mm · 3 of 60 slices shown]
[im 1/60]
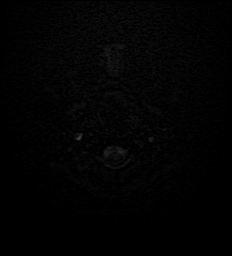
[im 30/60]
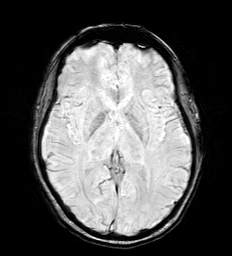
[im 60/60]
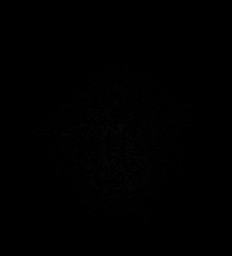

[Series 14: mip_images(sw) · axial · 24.0mm · 0.90mm/px · z∈[-147,+9]mm · 3 of 53 slices shown]
[im 1/53]
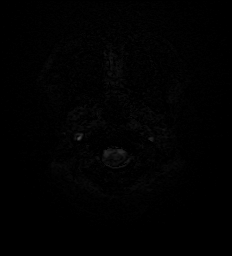
[im 27/53]
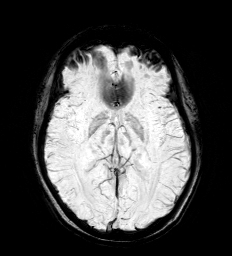
[im 53/53]
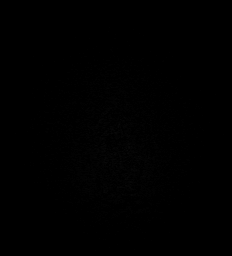

[Series 15: FLAIR · axial · 3.0mm · 0.53mm/px · z∈[-149,+12]mm · 3 of 55 slices shown]
[im 1/55]
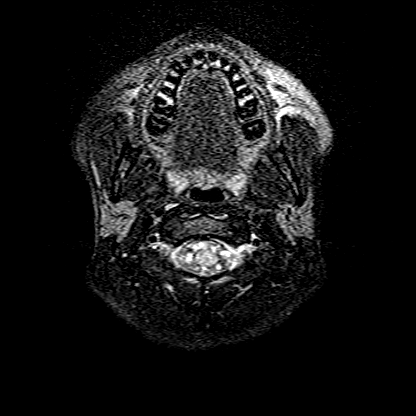
[im 28/55]
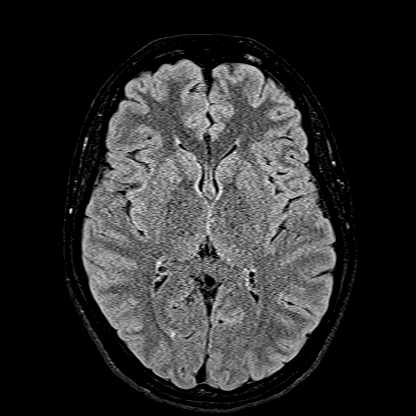
[im 55/55]
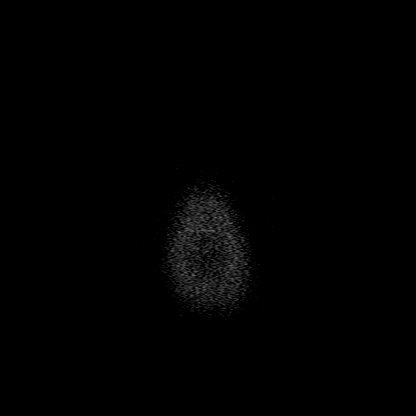

[Series 16: T1 · axial · 1.0mm · 0.98mm/px · z∈[-157,+17]mm · 9 of 176 slices shown (2 of 2)]
[im 1/176]
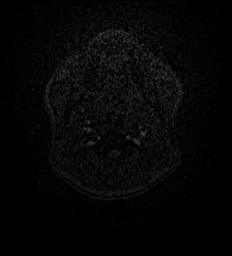
[im 22/176]
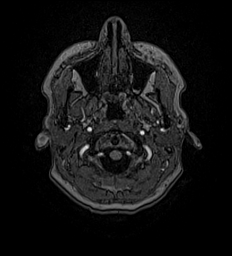
[im 44/176]
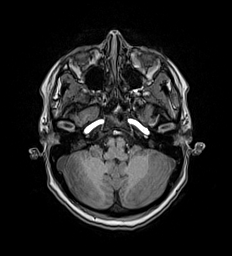
[im 66/176]
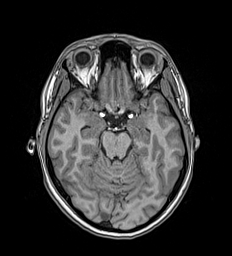
[im 88/176]
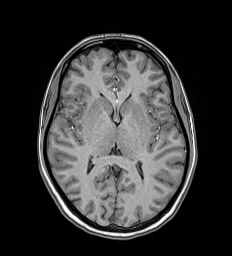
[im 110/176]
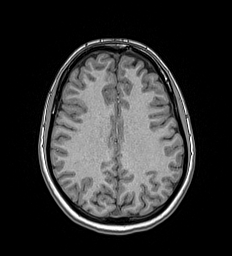
[im 132/176]
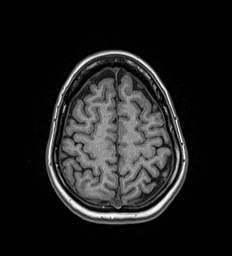
[im 154/176]
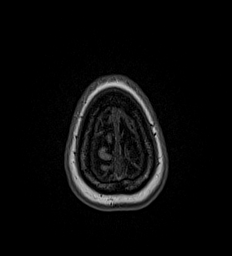
[im 176/176]
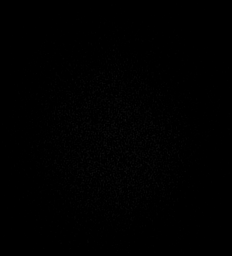

[Series 17: T2 post-contrast · coronal · 5.0mm · 0.57mm/px · 2 of 30 slices shown]
[im 1/30]
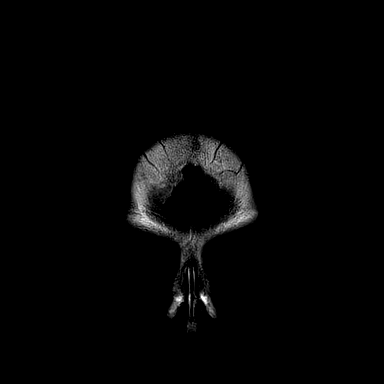
[im 30/30]
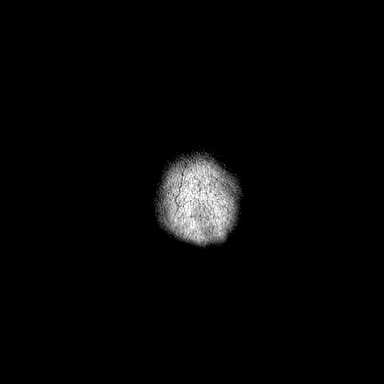

[Series 18: T1 post-contrast · axial · 1.0mm · 0.98mm/px · z∈[-157,+17]mm · 9 of 176 slices shown (1 of 2)]
[im 1/176]
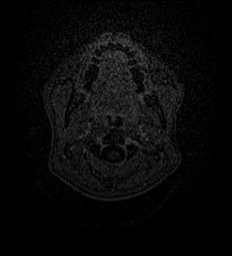
[im 22/176]
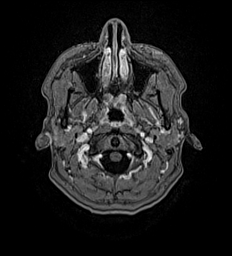
[im 44/176]
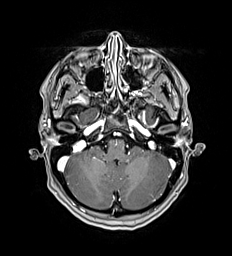
[im 66/176]
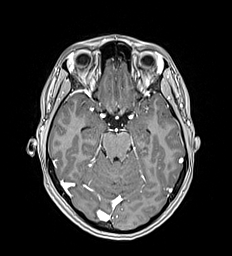
[im 88/176]
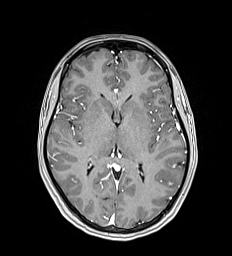
[im 110/176]
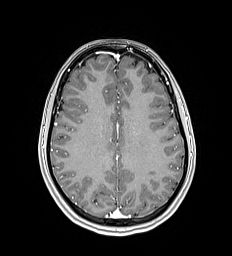
[im 132/176]
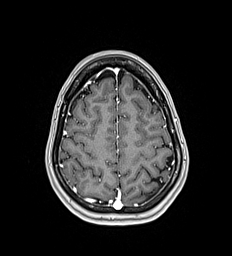
[im 154/176]
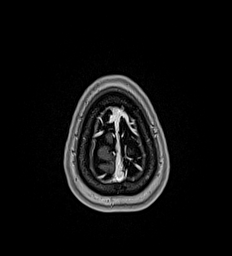
[im 176/176]
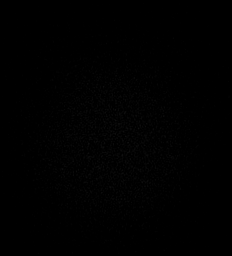

[Series 19: T1 post-contrast · coronal · 5.0mm · 0.57mm/px · 2 of 30 slices shown (2 of 2)]
[im 1/30]
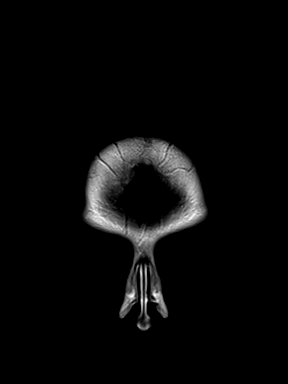
[im 30/30]
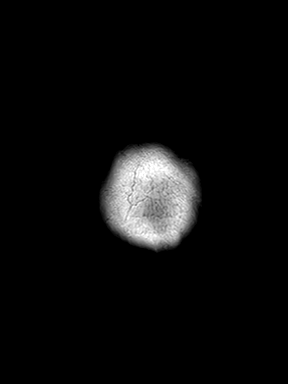

[48 of 48 positions shown; findings below may reference images not displayed]

FINDINGS: Brain: There is no evidence of acute infarct, intracranial
hemorrhage, mass, midline shift, or extra-axial fluid collection.
The ventricles and sulci are normal. The brain is normal in signal.
No abnormal enhancement is identified.

Vascular: Major intracranial vascular flow voids are preserved.

Skull and upper cervical spine: Unremarkable bone marrow signal.

Sinuses/Orbits: Unremarkable orbits. Clear paranasal sinuses. Small
bilateral mastoid effusions, similar to the prior study.

Other: None.
IMPRESSION: Unchanged and unremarkable appearance of the brain. No evidence of
demyelinating disease.

These results will be called to the ordering clinician or
representative by the [HOSPITAL] at the imaging location.

## 2020-01-06 ENCOUNTER — Other Ambulatory Visit: Payer: Self-pay | Admitting: Certified Nurse Midwife

## 2020-01-06 ENCOUNTER — Encounter: Payer: Self-pay | Admitting: Dermatology

## 2020-01-06 NOTE — Telephone Encounter (Signed)
Pt sent this and wants you to take a look at it.

## 2020-02-05 ENCOUNTER — Ambulatory Visit: Payer: 59 | Admitting: Dermatology

## 2020-03-17 ENCOUNTER — Other Ambulatory Visit (INDEPENDENT_AMBULATORY_CARE_PROVIDER_SITE_OTHER): Payer: 59 | Admitting: Certified Nurse Midwife

## 2020-03-17 DIAGNOSIS — R300 Dysuria: Secondary | ICD-10-CM

## 2020-03-17 MED ORDER — NITROFURANTOIN MONOHYD MACRO 100 MG PO CAPS: 100.0000 mg | ORAL_CAPSULE | Freq: Two times a day (BID) | ORAL | 1 refills | Status: DC

## 2020-03-17 NOTE — Progress Notes (Signed)
Rx Macrobid, see orders.    Diona Fanti, CNM [facility], [facility] 03/17/20 10:56 AM

## 2020-04-19 ENCOUNTER — Telehealth: Payer: Self-pay

## 2020-04-19 NOTE — Telephone Encounter (Signed)
Pharmacy updated per patient request.

## 2020-08-26 ENCOUNTER — Encounter: Payer: 59 | Admitting: Certified Nurse Midwife

## 2021-03-07 ENCOUNTER — Telehealth: Payer: Self-pay | Admitting: Certified Nurse Midwife

## 2021-03-07 DIAGNOSIS — N920 Excessive and frequent menstruation with regular cycle: Secondary | ICD-10-CM

## 2021-03-07 DIAGNOSIS — R635 Abnormal weight gain: Secondary | ICD-10-CM

## 2021-03-07 NOTE — Telephone Encounter (Signed)
Telephone call to patient, verified full name and date of birth.   LMP: 03/01/2021, reports heavy vaginal bleeding on Friday night with clots-size golf sized clots-total of four (4) in 24 hour period. No bleeding at this time.   History of UTI, treated with macrobid. Negative home pregnancy test x 1.   Reviewed red flag symptoms and when to call.   RTC as needed.    Dani Gobble, CNM [facility], [facility] 03/07/21 11:58 AM

## 2021-03-07 NOTE — Telephone Encounter (Signed)
Pt had multiple clots that passed over the weekend- pt called the on call who advised her to follow up instead of ER. Pt is concerned states no pain. Pt states it started Friday night and Saturday morning. Please Advise.

## 2021-04-21 ENCOUNTER — Encounter: Payer: Self-pay | Admitting: Certified Nurse Midwife

## 2021-04-21 ENCOUNTER — Ambulatory Visit: Payer: BC Managed Care – PPO | Admitting: Certified Nurse Midwife

## 2021-04-21 ENCOUNTER — Other Ambulatory Visit: Payer: Self-pay

## 2021-04-21 VITALS — BP 131/92 | HR 116 | Resp 16 | Ht 66.0 in | Wt 190.5 lb

## 2021-04-21 DIAGNOSIS — Z6830 Body mass index (BMI) 30.0-30.9, adult: Secondary | ICD-10-CM | POA: Diagnosis not present

## 2021-04-21 DIAGNOSIS — R635 Abnormal weight gain: Secondary | ICD-10-CM | POA: Diagnosis not present

## 2021-04-21 DIAGNOSIS — N920 Excessive and frequent menstruation with regular cycle: Secondary | ICD-10-CM

## 2021-04-21 NOTE — Progress Notes (Signed)
GYN ENCOUNTER NOTE  Subjective:       Stacey Harrison is a 25 y.o. G53P1001 female is here for gynecologic evaluation of the following issues:  1. Weight gain since starting Zoloft postpartum; stopped taking in December 2021-no changes with home treatment measures  2. Four (4) large (golf ball sized) blood clots with menses two (2) months ago  Denies difficulty breathing or respiratory distress, chest pain, abdominal pain, excessive vaginal bleeding, dysuria, and leg pain or swelling.    Gynecologic History  Patient's last menstrual period was 04/01/2021 (exact date).  Contraception: condoms  Last Pap: 07/2019. Results were: normal  Obstetric History  OB History  Gravida Para Term Preterm AB Living  1 1 1  0 0 1  SAB IAB Ectopic Multiple Live Births  0 0 0 0 1    # Outcome Date GA Lbr Len/2nd Weight Sex Delivery Anes PTL Lv  1 Term 03/08/19 [redacted]w[redacted]d / 01:46 6 lb 11.2 oz (3.04 kg) F Vag-Spont EPI N LIV    Past Medical History:  Diagnosis Date   Anxiety    Migraine     Past Surgical History:  Procedure Laterality Date   WISDOM TOOTH EXTRACTION  2014    Current Outpatient Medications on File Prior to Visit  Medication Sig Dispense Refill   Multiple Vitamin (MULTI-VITAMIN) tablet Take 1 tablet by mouth daily.     SUMAtriptan (IMITREX) 100 MG tablet TAKE 1 TABLET BY MOUTH ONCE AS NEEDED FOR MIGRAINE. MAY REPEAT IN 2 HRS IF HEADACHE PERSISTS 20 tablet 2   No current facility-administered medications on file prior to visit.    Allergies  Allergen Reactions   Hydrocodone Itching    Social History   Socioeconomic History   Marital status: Married    Spouse name: Not on file   Number of children: Not on file   Years of education: Not on file   Highest education level: Not on file  Occupational History   Not on file  Tobacco Use   Smoking status: Never   Smokeless tobacco: Never  Vaping Use   Vaping Use: Never used  Substance and Sexual Activity   Alcohol  use: Yes    Comment: rarely   Drug use: No   Sexual activity: Yes    Birth control/protection: Condom  Other Topics Concern   Not on file  Social History Narrative   Not on file   Social Determinants of Health   Financial Resource Strain: Not on file  Food Insecurity: Not on file  Transportation Needs: Not on file  Physical Activity: Not on file  Stress: Not on file  Social Connections: Not on file  Intimate Partner Violence: Not on file    Family History  Problem Relation Age of Onset   Breast cancer Maternal Grandmother    Ovarian cancer Neg Hx    Colon cancer Neg Hx    Diabetes Neg Hx     The following portions of the patient's history were reviewed and updated as appropriate: allergies, current medications, past family history, past medical history, past social history, past surgical history and problem list.  Review of Systems  ROS negative except as noted above. Information obtained from patient.   Objective:   BP (!) 131/92   Pulse (!) 116   Resp 16   Ht 5\' 6"  (1.676 m)   Wt 190 lb 8 oz (86.4 kg)   LMP 04/01/2021 (Exact Date)   BMI 30.75 kg/m   CONSTITUTIONAL: Well-developed, well-nourished female  in no acute distress.   NECK: Normal range of motion, supple, no masses.  Normal thyroid.   Assessment:   1. Menorrhagia with regular cycle  - FSH/LH - Estradiol - Progesterone - TSH + free T4  2. Weight gain  - TSH + free T4 - Hemoglobin A1c - Lipid panel  3. BMI 30.0-30.9,adult  - TSH + free T4 - Hemoglobin A1c - Lipid panel     Plan:   Labs today, see orders.   Weight management options discussed and handout provided; patient will contact CNM.   Reviewed red flag symptoms and when to call.   RTC as needed.    Dani Gobble, CNM [facility], [facility] 04/21/21 5:38 PM

## 2021-04-21 NOTE — Patient Instructions (Signed)
Obesity, Adult Obesity is having too much body fat. Being obese means that your weight is morethan what is healthy for you. BMI is a number that explains how much body fat you have. If you have a BMI of 30 or more, you are obese. Obesity is often caused by eating or drinking morecalories than your body uses. Changing your lifestyle can help you lose weight. Obesity can cause serious health problems, such as: Stroke. Coronary artery disease (CAD). Type 2 diabetes. Some types of cancer, including cancers of the colon, breast, uterus, and gallbladder. Osteoarthritis. High blood pressure (hypertension). High cholesterol. Sleep apnea. Gallbladder stones. Infertility problems. What are the causes? Eating meals each day that are high in calories, sugar, and fat. Being born with genes that may make you more likely to become obese. Having a medical condition that causes obesity. Taking certain medicines. Sitting a lot (having a sedentary lifestyle). Not getting enough sleep. Drinking a lot of drinks that have sugar in them. What increases the risk? Having a family history of obesity. Being an [place] American woman. Being a Hispanic man. Living in an area with limited access to: [place], recreation centers, or sidewalks. Healthy food choices, such as grocery stores and farmers' markets. What are the signs or symptoms? The main sign is having too much body fat. How is this treated? Treatment for this condition often includes changing your lifestyle. Treatment may include: Changing your diet. This may include making a healthy meal plan. Exercise. This may include activity that causes your heart to beat faster (aerobic exercise) and strength training. Work with your doctor to design a program that works for you. Medicine to help you lose weight. This may be used if you are not able to lose 1 pound a week after 6 weeks of healthy eating and more exercise. Treating conditions that cause the  obesity. Surgery. Options may include gastric banding and gastric bypass. This may be done if: Other treatments have not helped to improve your condition. You have a BMI of 40 or higher. You have life-threatening health problems related to obesity. Follow these instructions at home: Eating and drinking  Follow advice from your doctor about what to eat and drink. Your doctor may tell you to: Limit fast food, sweets, and processed snack foods. Choose low-fat options. For example, choose low-fat milk instead of whole milk. Eat 5 or more servings of fruits or vegetables each day. Eat at home more often. This gives you more control over what you eat. Choose healthy foods when you eat out. Learn to read food labels. This will help you learn how much food is in 1 serving. Keep low-fat snacks available. Avoid drinks that have a lot of sugar in them. These include soda, fruit juice, iced tea with sugar, and flavored milk. Drink enough water to keep your pee (urine) pale yellow. Do not go on fad diets.  Physical activity Exercise often, as told by your doctor. Most adults should get up to 150 minutes of moderate-intensity exercise every week.Ask your doctor: What types of exercise are safe for you. How often you should exercise. Warm up and stretch before being active. Do slow stretching after being active (cool down). Rest between times of being active. Lifestyle Work with your doctor and a food expert (dietitian) to set a weight-loss goal that is best for you. Limit your screen time. Find ways to reward yourself that do not involve food. Do not drink alcohol if: Your doctor tells you not to drink.  You are pregnant, may be pregnant, or are planning to become pregnant. If you drink alcohol: Limit how much you use to: 0-1 drink a day for women. 0-2 drinks a day for men. Be aware of how much alcohol is in your drink. In the [place] one drink equals one 12 oz bottle of beer (355 mL), one 5 oz  glass of wine (148 mL), or one 1 oz glass of hard liquor (44 mL). General instructions Keep a weight-loss journal. This can help you keep track of: The food that you eat. How much exercise you get. Take over-the-counter and prescription medicines only as told by your doctor. Take vitamins and supplements only as told by your doctor. Think about joining a support group. Keep all follow-up visits as told by your doctor. This is important. Contact a doctor if: You cannot meet your weight loss goal after you have changed your diet and lifestyle for 6 weeks. Get help right away if you: Are having trouble breathing. Are having thoughts of harming yourself. Summary Obesity is having too much body fat. Being obese means that your weight is more than what is healthy for you. Work with your doctor to set a weight-loss goal. Get regular exercise as told by your doctor. This information is not intended to replace advice given to you by your health care provider. Make sure you discuss any questions you have with your healthcare provider. Document Revised: 06/13/2018 Document Reviewed: 06/13/2018 Elsevier Patient Education  2022 [employer].   Menorrhagia Menorrhagia is when your monthly periods are heavy or last longer than normal. If you have this condition, bleeding and cramping may make it hard for you todo your daily activities. What are the causes? Common causes of this condition include: Growths in the womb (uterus). These are polyps or fibroids. These growths are not cancer. Problems with two hormones called estrogen and progesterone. One of the ovaries not releasing an egg during one or more months. A problem with the thyroid gland. Having a device for birth control (IUD). Side effects of some medicines, such as NSAIDs or blood thinners. A disorder that stops the blood from clotting normally. What increases the risk? You are more likely to have this condition if you have cancer of  the womb. What are the signs or symptoms? Having to change your pad or tampon every 1-2 hours because it is soaked. Needing to use pads and tampons at the same time because of heavy bleeding. Needing to wake up to change your pads or tampons during the night. Passing blood clots larger than 1 inch (2.5 cm) in size. Having bleeding that lasts for more than 7 days. Having symptoms of low iron levels (anemia), such as feeling tired or having shortness of breath. How is this treated? You may not need to be treated for this condition. But if you need treatment, you may be given medicines: To reduce bleeding during your period. These include birth control medicines. To make your blood thick. This slows bleeding. To reduce swelling. Medicines that do this include ibuprofen. That have a hormone called progestin. That make the ovaries stop working for a short time. To treat low iron levels. You will be given iron pills if you have this condition. If medicines do not work, surgery may be done. Surgery may be done to: Remove a part of the lining of the womb. This lining is called the endometrium. This reduces bleeding during a period. Remove growths in the womb. These may be  polyps or fibroids. Remove the entire lining of the womb. Remove the womb entirely. This procedure is called a hysterectomy. Follow these instructions at home: Medicines Take over-the-counter and prescription medicines only as told by your doctor. This includes iron pills. Do not change or switch medicines without asking your doctor. Do not take aspirin or medicines that contain aspirin 1 week before or during your period. Aspirin may make bleeding worse. Managing constipation Iron pills may cause trouble pooping (constipation). To prevent or treat problems when pooping, you may need to: Drink enough fluid to keep your pee (urine) pale yellow. Take over-the-counter or prescription medicines. Eat foods that are high in fiber.  These include beans, whole grains, and fresh fruits and vegetables. Limit foods that are high in fat and sugar. These include fried or sweet foods. General instructions If you need to change your pad or tampon more than once every 2 hours, limit your activity until the bleeding stops. Eat healthy meals and foods that are high in iron. Foods that have a lot of iron include: Leafy green vegetables. Meat. Liver. Eggs. Whole-grain breads and cereals. Do not try to lose weight until your heavy bleeding has stopped and you have normal amounts of iron in your blood. If you need to lose weight, work with your doctor. Keep all follow-up visits. Contact a doctor if: You soak through a pad or tampon every 1 or 2 hours, and this happens every time you have a period. You need to use pads and tampons at the same time because you are bleeding so much. You are taking medicine, and: You feel like you may vomit. You vomit. You have watery poop (diarrhea). You have other problems that may be related to the medicine you are taking. Get help right away if: You soak through more than a pad or tampon in 1 hour. You pass clots bigger than 1 inch (2.5 cm) wide. You feel short of breath. You feel like your heart is beating too fast. You feel dizzy or you faint. You feel very weak or tired. Summary Menorrhagia is when your menstrual periods are heavy or last longer than normal. You may not need to be treated for this condition. If you need treatment, you may be given medicines or have surgery. Take over-the-counter and prescription medicines only as told by your doctor. This includes iron pills. Get help right away if you soak through more than a pad or tampon in 1 hour or you pass large clots. Also, get help right away if you feel dizzy, short of breath, or very weak or tired. This information is not intended to replace advice given to you by your health care provider. Make sure you discuss any questions you  have with your healthcare provider. Document Revised: 06/22/2020 Document Reviewed: 06/22/2020 Elsevier Patient Education  2022 [employer].

## 2021-04-22 LAB — LIPID PANEL
Chol/HDL Ratio: 2.2 ratio (ref 0.0–4.4)
Cholesterol, Total: 162 mg/dL (ref 100–199)
HDL: 74 mg/dL (ref >39)
LDL Chol Calc (NIH): 75 mg/dL (ref 0–99)
Triglycerides: 64 mg/dL (ref 0–149)
VLDL Cholesterol Cal: 13 mg/dL (ref 5–40)

## 2021-04-22 LAB — TSH+FREE T4
Free T4: 1.08 ng/dL (ref 0.82–1.77)
TSH: 1.26 u[IU]/mL (ref 0.450–4.500)

## 2021-04-22 LAB — FSH/LH
FSH: 2.9 m[IU]/mL
LH: 5.8 m[IU]/mL

## 2021-04-22 LAB — HEMOGLOBIN A1C
Est. average glucose Bld gHb Est-mCnc: 97 mg/dL
Hgb A1c MFr Bld: 5 % (ref 4.8–5.6)

## 2021-04-22 LAB — PROGESTERONE: Progesterone: 11.8 ng/mL

## 2021-04-22 LAB — ESTRADIOL: Estradiol: 168 pg/mL

## 2021-05-11 MED ORDER — SUMATRIPTAN SUCCINATE 100 MG PO TABS: ORAL_TABLET | ORAL | 2 refills | Status: AC

## 2021-09-29 LAB — OB RESULTS CONSOLE RUBELLA ANTIBODY, IGM: Rubella: IMMUNE

## 2021-09-29 LAB — OB RESULTS CONSOLE VARICELLA ZOSTER ANTIBODY, IGG: Varicella: IMMUNE

## 2021-09-29 LAB — OB RESULTS CONSOLE HEPATITIS B SURFACE ANTIGEN: Hepatitis B Surface Ag: NEGATIVE

## 2021-10-23 NOTE — L&D Delivery Note (Addendum)
Delivery Note  First Stage: Labor onset: 0800 Augmentation : cytotec x 2, pitocin Analgesia /Anesthesia intrapartum: epidural SROM at 1315  Second Stage: Complete dilation at 1812 Onset of pushing at 1814 FHR second stage Cat II, recurrent variable decels  Delivery of a viable female infant 04/10/2022 at [facility] by Stacey Harrison, CNM delivery of fetal head in OA position with restitution to LOA. No nuchal cord;  Anterior then posterior shoulders delivered easily with gentle downward traction. Baby placed on mom's chest, and attended to by peds.  Cord double clamped after cessation of pulsation, cut by FOB  Third Stage: Placenta delivered [name] intact with 3 VC @ 1839 Placenta disposition: discarded Uterine tone firm / bleeding scant  No laceration identified  Anesthesia for repair: none Repair none Est. Blood Loss (mL): 264BR  Complications: none  Mom to postpartum.  Baby to Couplet care / Skin to Skin.  Newborn: "[name]" Birth Weight: 7lb 13.6oz Apgar Scores: 8, 9 Feeding planned: breast

## 2022-03-16 LAB — OB RESULTS CONSOLE GC/CHLAMYDIA
Chlamydia: NEGATIVE
Neisseria Gonorrhea: NEGATIVE

## 2022-03-16 LAB — OB RESULTS CONSOLE RPR: RPR: NONREACTIVE

## 2022-03-16 LAB — OB RESULTS CONSOLE GBS: GBS: NEGATIVE

## 2022-03-16 LAB — OB RESULTS CONSOLE HIV ANTIBODY (ROUTINE TESTING): HIV: NONREACTIVE

## 2022-03-23 ENCOUNTER — Other Ambulatory Visit: Payer: Self-pay | Admitting: Obstetrics and Gynecology

## 2022-03-23 DIAGNOSIS — Z3490 Encounter for supervision of normal pregnancy, unspecified, unspecified trimester: Secondary | ICD-10-CM

## 2022-03-23 NOTE — Progress Notes (Signed)
G1P1001 at [redacted]w[redacted]d LMP of 06/29/21, c/w early [place] 849w2d Scheduled for an elective induction of labor  on 04/05/22.   [facility] Pregnancy complicated by: Anemia Hx of postpartum depression/anxiety  Prenatal Labs: Blood type/Rh A Pos  Antibody screen neg  Rubella Immune  Varicella Immune  RPR NR  HBsAg Neg  Hep C NR  HIV NR  GC neg  Chlamydia neg  Genetic screening cfDNA negative  1 hour GTT 140  3 hour GTT  [phone] neg   Tdap: 01/12/22 Flu: not in season Contraception: husband to get vasectomy Feeding preference: Breast/Bottle  ____ FeAvelino LeedsCNM Certified Nurse Midwife [facility]

## 2022-03-30 ENCOUNTER — Other Ambulatory Visit: Payer: Self-pay | Admitting: Obstetrics and Gynecology

## 2022-04-05 ENCOUNTER — Observation Stay
Admission: RE | Admit: 2022-04-05 | Discharge: 2022-04-05 | Disposition: A | Discharge status: Home / Self Care | Payer: BC Managed Care – PPO | Source: Ambulatory Visit | Attending: Obstetrics and Gynecology | Admitting: Obstetrics and Gynecology

## 2022-04-05 ENCOUNTER — Other Ambulatory Visit: Payer: Self-pay

## 2022-04-05 DIAGNOSIS — D649 Anemia, unspecified: Secondary | ICD-10-CM | POA: Diagnosis not present

## 2022-04-05 DIAGNOSIS — Z3A40 40 weeks gestation of pregnancy: Secondary | ICD-10-CM | POA: Insufficient documentation

## 2022-04-05 DIAGNOSIS — Z369 Encounter for antenatal screening, unspecified: Secondary | ICD-10-CM | POA: Diagnosis not present

## 2022-04-05 DIAGNOSIS — O99013 Anemia complicating pregnancy, third trimester: Secondary | ICD-10-CM | POA: Diagnosis not present

## 2022-04-05 DIAGNOSIS — O26893 Other specified pregnancy related conditions, third trimester: Secondary | ICD-10-CM | POA: Diagnosis present

## 2022-04-05 DIAGNOSIS — Z79899 Other long term (current) drug therapy: Secondary | ICD-10-CM | POA: Diagnosis not present

## 2022-04-05 NOTE — Discharge Summary (Signed)
Stacey Harrison is a 26 y.o. female. She is at 26w0dgestation. Patient's last menstrual period was 06/29/2021. Estimated Date of Delivery: 04/05/22  [facility]  Current pregnancy complicated by:  Anemia Hx of postpartum depression/anxiety  Chief complaint: scheduled elective induction of labor - feeling rare tightening, no LOF or VB.  - active FM    S: Resting comfortably. no CTX, no VB.no LOF,  Active fetal movement. Denies: HA, visual changes, SOB, or RUQ/epigastric pain  Maternal Medical History:   Past Medical History:  Diagnosis Date   Anxiety    Migraine     Past Surgical History:  Procedure Laterality Date   WISDOM TOOTH EXTRACTION  2014    Allergies  Allergen Reactions   Hydrocodone Itching    Prior to Admission medications   Medication Sig Start Date End Date Taking? Authorizing Provider  escitalopram (LEXAPRO) 10 MG tablet Take 10 mg by mouth daily.   Yes [provider]  ferrous sulfate 325 (65 FE) MG tablet Take 325 mg by mouth daily with breakfast.   Yes [provider]  Multiple Vitamin (MULTI-VITAMIN) tablet Take 1 tablet by mouth daily.    [provider]  SUMAtriptan (IMITREX) 100 MG tablet TAKE 1 TABLET BY MOUTH ONCE AS NEEDED FOR MIGRAINE. MAY REPEAT IN 2 HRS IF HEADACHE PERSISTS 05/11/21   CRubie Maid MD      Social History: She  reports that she has never smoked. She has never used smokeless tobacco. She reports that she does not currently use alcohol. She reports that she does not use drugs.  Family History: family history includes Breast cancer in her maternal grandmother.   Review of Systems: A full review of systems was performed and negative except as noted in the HPI.     O:  BP 109/82 (BP Location: Left Arm)   Pulse (!) 112   Temp 98 F (36.7 C) (Oral)   Resp 16   Ht '5\' 7"'$  (1.702 m)   LMP 06/29/2021   BMI 29.84 kg/m  No results found for this or any previous visit  (from the past 48 hour(s)).   Constitutional: NAD, AAOx3  HE/ENT: extraocular movements grossly intact, moist mucous membranes CV: RRR PULM: nl respiratory effort, CTABL     Abd: gravid, non-tender, non-distended, soft      Ext: Non-tender, Nonedematous   Psych: mood appropriate, speech normal Pelvic: SVE: FTP/50/-2, posterior, medium-firm  Fetal  monitoring: Cat I Appropriate for GA Baseline: 155bpm Variability: moderate Accelerations:  present x >2 Decelerations absent Time 241ms    A/P: [age].o. 4023w0dre for antenatal surveillance for term pregnancy  Principle Diagnosis:    Labor: not present. Unfavorable cervix, discussed risk of long IOL, Cesarean, additional interventions. Pt opts to reschedule and DC home.  Fetal Wellbeing: Reassuring Cat 1 tracing. Reactive NST  Rescheduled IOL for 04/10/22, given instructions for red rasp leaf tea, evening primrose oil.  D/c home stable, labor precautions reviewed, follow-up as scheduled.    RebFrancetta FoundNM 04/05/2022  11:48 AM

## 2022-04-05 NOTE — OB Triage Note (Signed)
Patient discharged ambulatory home with significant other in good condition. Patient given discharge instructions and verbalizes understanding.

## 2022-04-10 ENCOUNTER — Inpatient Hospital Stay
Admission: EM | Admit: 2022-04-10 | Discharge: 2022-04-11 | DRG: 807 | Disposition: A | Discharge status: Home / Self Care | Payer: BC Managed Care – PPO | Attending: Obstetrics | Admitting: Obstetrics

## 2022-04-10 ENCOUNTER — Encounter: Payer: Self-pay | Admitting: Obstetrics and Gynecology

## 2022-04-10 ENCOUNTER — Inpatient Hospital Stay: Payer: BC Managed Care – PPO | Admitting: Anesthesiology

## 2022-04-10 ENCOUNTER — Other Ambulatory Visit: Payer: Self-pay

## 2022-04-10 DIAGNOSIS — Z885 Allergy status to narcotic agent status: Secondary | ICD-10-CM

## 2022-04-10 DIAGNOSIS — Z3A40 40 weeks gestation of pregnancy: Secondary | ICD-10-CM | POA: Diagnosis not present

## 2022-04-10 DIAGNOSIS — O480 Post-term pregnancy: Secondary | ICD-10-CM | POA: Diagnosis present

## 2022-04-10 DIAGNOSIS — O9902 Anemia complicating childbirth: Secondary | ICD-10-CM | POA: Diagnosis not present

## 2022-04-10 DIAGNOSIS — Z3490 Encounter for supervision of normal pregnancy, unspecified, unspecified trimester: Principal | ICD-10-CM | POA: Diagnosis present

## 2022-04-10 LAB — CBC
HCT: 29.9 % — ABNORMAL LOW (ref 36.0–46.0)
Hemoglobin: 9.9 g/dL — ABNORMAL LOW (ref 12.0–15.0)
MCH: 27.7 pg (ref 26.0–34.0)
MCHC: 33.1 g/dL (ref 30.0–36.0)
MCV: 83.5 fL (ref 80.0–100.0)
Platelets: 206 10*3/uL (ref 150–400)
RBC: 3.58 MIL/uL — ABNORMAL LOW (ref 3.87–5.11)
RDW: 16.3 % — ABNORMAL HIGH (ref 11.5–15.5)
WBC: 10.5 10*3/uL (ref 4.0–10.5)
nRBC: 0 % (ref 0.0–0.2)

## 2022-04-10 LAB — RPR: RPR Ser Ql: NONREACTIVE

## 2022-04-10 LAB — TYPE AND SCREEN
ABO/RH(D): A POS
Antibody Screen: NEGATIVE

## 2022-04-10 MED ORDER — BENZOCAINE-MENTHOL 20-0.5 % EX AERO
1.0000 | INHALATION_SPRAY | CUTANEOUS | Status: DC | PRN
  Administered 2022-04-10: 1 via TOPICAL
  Filled 2022-04-10 (×2): qty 56

## 2022-04-10 MED ORDER — OXYTOCIN-SODIUM CHLORIDE 30-0.9 UT/500ML-% IV SOLN
1.0000 m[IU]/min | INTRAVENOUS | Status: DC
  Administered 2022-04-10: 2 m[IU]/min via INTRAVENOUS
  Filled 2022-04-10: qty 500

## 2022-04-10 MED ORDER — DIPHENHYDRAMINE HCL 25 MG PO CAPS: 25.0000 mg | ORAL_CAPSULE | Freq: Four times a day (QID) | ORAL | Status: DC | PRN

## 2022-04-10 MED ORDER — ONDANSETRON HCL 4 MG/2ML IJ SOLN
4.0000 mg | Freq: Four times a day (QID) | INTRAMUSCULAR | Status: DC | PRN
  Administered 2022-04-10: 4 mg via INTRAVENOUS
  Filled 2022-04-10: qty 2

## 2022-04-10 MED ORDER — LIDOCAINE HCL (PF) 1 % IJ SOLN
INTRAMUSCULAR | Status: DC | PRN
  Administered 2022-04-10: 2 mL

## 2022-04-10 MED ORDER — IBUPROFEN 600 MG PO TABS
600.0000 mg | ORAL_TABLET | Freq: Four times a day (QID) | ORAL | Status: DC
  Administered 2022-04-11 (×4): 600 mg via ORAL
  Filled 2022-04-10 (×4): qty 1

## 2022-04-10 MED ORDER — COCONUT OIL OIL
1.0000 | TOPICAL_OIL | Status: DC | PRN
  Administered 2022-04-11: 1 via TOPICAL
  Filled 2022-04-10: qty 120

## 2022-04-10 MED ORDER — FENTANYL-BUPIVACAINE-NACL 0.5-0.125-0.9 MG/250ML-% EP SOLN
EPIDURAL | Status: AC
  Filled 2022-04-10: qty 250

## 2022-04-10 MED ORDER — ACETAMINOPHEN 325 MG PO TABS: 650.0000 mg | ORAL_TABLET | ORAL | Status: DC | PRN

## 2022-04-10 MED ORDER — BUPIVACAINE HCL (PF) 0.25 % IJ SOLN
INTRAMUSCULAR | Status: DC | PRN
  Administered 2022-04-10 (×2): 5 mL via EPIDURAL

## 2022-04-10 MED ORDER — ESCITALOPRAM OXALATE 10 MG PO TABS
10.0000 mg | ORAL_TABLET | Freq: Every day | ORAL | Status: DC
  Administered 2022-04-11: 10 mg via ORAL
  Filled 2022-04-10 (×2): qty 1

## 2022-04-10 MED ORDER — LACTATED RINGERS IV SOLN
500.0000 mL | INTRAVENOUS | Status: DC | PRN
  Administered 2022-04-10: 500 mL via INTRAVENOUS

## 2022-04-10 MED ORDER — ACETAMINOPHEN 325 MG PO TABS
650.0000 mg | ORAL_TABLET | ORAL | Status: DC | PRN
  Administered 2022-04-10 – 2022-04-11 (×2): 650 mg via ORAL
  Filled 2022-04-10 (×2): qty 2

## 2022-04-10 MED ORDER — LACTATED RINGERS IV SOLN: INTRAVENOUS | Status: DC

## 2022-04-10 MED ORDER — LIDOCAINE HCL (PF) 1 % IJ SOLN: 30.0000 mL | INTRAMUSCULAR | Status: DC | PRN

## 2022-04-10 MED ORDER — MISOPROSTOL 25 MCG QUARTER TABLET
25.0000 ug | ORAL_TABLET | ORAL | Status: DC | PRN
  Administered 2022-04-10 (×2): 25 ug via VAGINAL
  Filled 2022-04-10 (×2): qty 1

## 2022-04-10 MED ORDER — OXYTOCIN 10 UNIT/ML IJ SOLN
INTRAMUSCULAR | Status: AC
  Filled 2022-04-10: qty 1

## 2022-04-10 MED ORDER — LIDOCAINE-EPINEPHRINE (PF) 1.5 %-1:200000 IJ SOLN
INTRAMUSCULAR | Status: DC | PRN
  Administered 2022-04-10: 3 mL via EPIDURAL

## 2022-04-10 MED ORDER — AMMONIA AROMATIC IN INHA
RESPIRATORY_TRACT | Status: AC
  Filled 2022-04-10: qty 10

## 2022-04-10 MED ORDER — PRENATAL MULTIVITAMIN CH
1.0000 | ORAL_TABLET | Freq: Every day | ORAL | Status: DC
  Administered 2022-04-11: 1 via ORAL
  Filled 2022-04-10: qty 1

## 2022-04-10 MED ORDER — EPHEDRINE 5 MG/ML INJ: 10.0000 mg | INTRAVENOUS | Status: DC | PRN

## 2022-04-10 MED ORDER — ZOLPIDEM TARTRATE 5 MG PO TABS: 5.0000 mg | ORAL_TABLET | Freq: Every evening | ORAL | Status: DC | PRN

## 2022-04-10 MED ORDER — TETANUS-DIPHTH-ACELL PERTUSSIS 5-2.5-18.5 LF-MCG/0.5 IM SUSY
0.5000 mL | PREFILLED_SYRINGE | Freq: Once | INTRAMUSCULAR | Status: DC
  Filled 2022-04-10: qty 0.5

## 2022-04-10 MED ORDER — OXYTOCIN BOLUS FROM INFUSION
333.0000 mL | Freq: Once | INTRAVENOUS | Status: AC
  Administered 2022-04-10: 333 mL via INTRAVENOUS

## 2022-04-10 MED ORDER — OXYTOCIN-SODIUM CHLORIDE 30-0.9 UT/500ML-% IV SOLN
2.5000 [IU]/h | INTRAVENOUS | Status: DC
  Administered 2022-04-10: 2.5 [IU]/h via INTRAVENOUS

## 2022-04-10 MED ORDER — ONDANSETRON HCL 4 MG/2ML IJ SOLN: 4.0000 mg | INTRAMUSCULAR | Status: DC | PRN

## 2022-04-10 MED ORDER — LIDOCAINE HCL (PF) 1 % IJ SOLN
INTRAMUSCULAR | Status: AC
  Filled 2022-04-10: qty 30

## 2022-04-10 MED ORDER — DIPHENHYDRAMINE HCL 50 MG/ML IJ SOLN: 12.5000 mg | INTRAMUSCULAR | Status: DC | PRN

## 2022-04-10 MED ORDER — SENNOSIDES-DOCUSATE SODIUM 8.6-50 MG PO TABS
2.0000 | ORAL_TABLET | Freq: Every day | ORAL | Status: DC
  Administered 2022-04-11: 2 via ORAL
  Filled 2022-04-10: qty 2

## 2022-04-10 MED ORDER — FENTANYL-BUPIVACAINE-NACL 0.5-0.125-0.9 MG/250ML-% EP SOLN
12.0000 mL/h | EPIDURAL | Status: DC | PRN
  Administered 2022-04-10: 12 mL/h via EPIDURAL

## 2022-04-10 MED ORDER — SIMETHICONE 80 MG PO CHEW: 80.0000 mg | CHEWABLE_TABLET | ORAL | Status: DC | PRN

## 2022-04-10 MED ORDER — ONDANSETRON HCL 4 MG PO TABS: 4.0000 mg | ORAL_TABLET | ORAL | Status: DC | PRN

## 2022-04-10 MED ORDER — DIBUCAINE (PERIANAL) 1 % EX OINT
1.0000 | TOPICAL_OINTMENT | CUTANEOUS | Status: DC | PRN
  Filled 2022-04-10: qty 28

## 2022-04-10 MED ORDER — TERBUTALINE SULFATE 1 MG/ML IJ SOLN: 0.2500 mg | Freq: Once | INTRAMUSCULAR | Status: DC | PRN

## 2022-04-10 MED ORDER — SOD CITRATE-CITRIC ACID 500-334 MG/5ML PO SOLN: 30.0000 mL | ORAL | Status: DC | PRN

## 2022-04-10 MED ORDER — MISOPROSTOL 25 MCG QUARTER TABLET
25.0000 ug | ORAL_TABLET | ORAL | Status: DC | PRN
  Administered 2022-04-10 (×2): 25 ug via BUCCAL
  Filled 2022-04-10 (×2): qty 1

## 2022-04-10 MED ORDER — PHENYLEPHRINE 80 MCG/ML (10ML) SYRINGE FOR IV PUSH (FOR BLOOD PRESSURE SUPPORT)
80.0000 ug | PREFILLED_SYRINGE | INTRAVENOUS | Status: DC | PRN
  Filled 2022-04-10: qty 10

## 2022-04-10 MED ORDER — OXYTOCIN-SODIUM CHLORIDE 30-0.9 UT/500ML-% IV SOLN
INTRAVENOUS | Status: AC
  Filled 2022-04-10: qty 500

## 2022-04-10 MED ORDER — MISOPROSTOL 200 MCG PO TABS
ORAL_TABLET | ORAL | Status: AC
  Filled 2022-04-10: qty 4

## 2022-04-10 MED ORDER — WITCH HAZEL-GLYCERIN EX PADS
1.0000 | MEDICATED_PAD | CUTANEOUS | Status: DC | PRN
  Administered 2022-04-10: 1 via TOPICAL
  Filled 2022-04-10 (×2): qty 100

## 2022-04-10 MED ORDER — BUTORPHANOL TARTRATE 2 MG/ML IJ SOLN: 1.0000 mg | INTRAMUSCULAR | Status: DC | PRN

## 2022-04-10 MED ORDER — LACTATED RINGERS IV SOLN
500.0000 mL | Freq: Once | INTRAVENOUS | Status: AC
Start: 2022-04-10 — End: 2022-04-10
  Administered 2022-04-10: 500 mL via INTRAVENOUS

## 2022-04-10 MED ORDER — PHENYLEPHRINE 80 MCG/ML (10ML) SYRINGE FOR IV PUSH (FOR BLOOD PRESSURE SUPPORT)
80.0000 ug | PREFILLED_SYRINGE | INTRAVENOUS | Status: DC | PRN
  Administered 2022-04-10: 80 ug via INTRAVENOUS

## 2022-04-10 NOTE — Progress Notes (Signed)
Stacey Harrison is a 26 y.o. G2P1001 presenting to L&D for scheduled induction. RN discussed induction and evaluated her understanding of the process. She verbalized understanding of why she is being induced and agreed to start the process. She reports positive fetal movement and denies vaginal bleeding, leaking of fluid, and regular contractions. We discussed her birth preferences, including epidural pain management and exclusively pumping. RN educated her on pumping and the potential need for formula or donor milk until she is able to produce milk. RN provided education on what to expect with misoprostol. Initial vital signs stable. Fetal monitors applied and education given. Initial FHT 160. Stacey Harrison has been oriented to the care environment, including call bell and bed control use. RN explained the admission packet, including birth certificate worksheet, [name], and feeding log. Her husband Stacey Harrison is at the bedside for labor support. Stacey Harrison is resting comfortably after a well tolerated cervical exam and misoprostol placement. Plan to encourage rest, assist with peanut ball position changes to encourage fetal engagement as labor progresses, and reassess cervix in 4 hours.

## 2022-04-10 NOTE — H&P (Signed)
OB History & Physical   History of Present Illness:  Chief Complaint:   HPI:  Stacey Harrison is a 26 y.o. G2P1001 female at 19w5ddated by [place]  She presents to L&D for scheduled IOL for post-dates    Pregnancy Issues: Anemia Hx of postpartum depression/anxiety   Maternal Medical History:   Past Medical History:  Diagnosis Date   Anxiety    Migraine     Past Surgical History:  Procedure Laterality Date   WISDOM TOOTH EXTRACTION  2014    Allergies  Allergen Reactions   Hydrocodone Itching    Prior to Admission medications   Medication Sig Start Date End Date Taking? Authorizing Provider  escitalopram (LEXAPRO) 10 MG tablet Take 10 mg by mouth daily.   Yes [provider]  ferrous sulfate 325 (65 FE) MG tablet Take 325 mg by mouth daily with breakfast.   Yes [provider]  magnesium 30 MG tablet Take 30 mg by mouth 2 (two) times daily.   Yes [provider]  Multiple Vitamin (MULTI-VITAMIN) tablet Take 1 tablet by mouth daily.   Yes [provider]  SUMAtriptan (IMITREX) 100 MG tablet TAKE 1 TABLET BY MOUTH ONCE AS NEEDED FOR MIGRAINE. MAY REPEAT IN 2 HRS IF HEADACHE PERSISTS Patient not taking: Reported on 04/10/2022 05/11/21   CRubie Maid MD     [place] Social History: She  reports that she has never smoked. She has never used smokeless tobacco. She reports that she does not currently use alcohol. She reports that she does not use drugs.  Family History: family history includes Breast cancer in her maternal grandmother.   Review of Systems: A full review of systems was performed and negative except as noted in the HPI.     Physical Exam:  Vital Signs: BP 115/72 (BP Location: Left Arm)   Pulse 92   Temp 97.9 F (36.6 C) (Axillary)   Resp 16   LMP 06/29/2021  General: no acute distress.  HEENT: normocephalic, atraumatic Heart: regular rate & rhythm.  No murmurs/rubs/gallops Lungs:  clear to auscultation bilaterally, normal respiratory effort Abdomen: soft, gravid, non-tender;  EFW: 7.5lb Pelvic:   External: Normal external female genitalia  Cervix: by RN exam - 1.5/thick/ballottable   Extremities: non-tender, symmetric, mild edema bilaterally.  DTRs: +2  Neurologic: Alert & oriented x 3.    Results for orders placed or performed during the hospital encounter of 04/10/22 (from the past 24 hour(s))  CBC     Status: Abnormal   Collection Time: 04/10/22  2:03 AM  Result Value Ref Range   WBC 10.5 4.0 - 10.5 K/uL   RBC 3.58 (L) 3.87 - 5.11 MIL/uL   Hemoglobin 9.9 (L) 12.0 - 15.0 g/dL   HCT 29.9 (L) 36.0 - 46.0 %   MCV 83.5 80.0 - 100.0 fL   MCH 27.7 26.0 - 34.0 pg   MCHC 33.1 30.0 - 36.0 g/dL   RDW 16.3 (H) 11.5 - 15.5 %   Platelets 206 150 - 400 K/uL   nRBC 0.0 0.0 - 0.2 %  Type and screen     Status: None   Collection Time: 04/10/22  2:03 AM  Result Value Ref Range   ABO/RH(D) A POS    Antibody Screen NEG    Sample Expiration      04/13/2022,2359 Performed at [facility] [address] [place]  [id number]    Pertinent Results:  Prenatal Labs: Blood  type/Rh A pos  Antibody screen neg  Rubella Immune  Varicella Immune  RPR NR  HBsAg Neg  HIV NR  GC neg  Chlamydia neg  Genetic screening cfDNA negative  1 hour GTT 140  3 hour GTT 85, 139, 111, 119  GBS negative   Tdap: 01/12/22 Flu: not in season Contraception: husband to get vasectomy Feeding preference: Breast/Bottle  FHT: 135bpm, moderate variability, accels present TOCO: contractions 1-33mn, SVE:  at 0645: 19.9/IPJAS/NKNLZJQBHAL  Cephalic by leopolds  No results found.  Assessment:  Stacey BRZOSKAis a 26y.o. G2P1001 female at 448w5dith IOL for post-dates.   Plan:  1. Admit to Labor & Delivery; consents reviewed and obtained  2. Fetal Well being  - Fetal Tracing: Category I - Group B Streptococcus ppx indicated: n/a, GBS negative - Presentation:  vertex confirmed by Leopolds   3. Routine OB: - Prenatal labs reviewed, as above - Rh pos - CBC, T&S, RPR on admit - Clear fluids, saline lock  4. Induction of Labor -  Contractions q1-64m58m external toco in place -  Pelvis proven to 3040g -  Plan for induction with cytotec, Foley balloon, AROM, and pitocin -  Plan for continuous fetal monitoring  -  Maternal pain control as desired; requesting regional anesthesia - Anticipate vaginal delivery  5. Post Partum Planning: - Infant feeding: breast and bottle - Contraception: husband to get vasectomy  DanGertie FeyNM 04/10/22 9:11 AM

## 2022-04-10 NOTE — Anesthesia Preprocedure Evaluation (Signed)
Anesthesia Evaluation  Patient identified by MRN, date of birth, ID band Patient awake    Reviewed: Allergy & Precautions, H&P , NPO status , Patient's Chart, lab work & pertinent test results  History of Anesthesia Complications Negative for: history of anesthetic complications  Airway Mallampati: II  TM Distance: >3 FB     Dental  (+) Dental Advidsory Given   Pulmonary neg pulmonary ROS,           Cardiovascular negative cardio ROS       Neuro/Psych  Headaches, Anxiety    GI/Hepatic Neg liver ROS, GERD  ,  Endo/Other  negative endocrine ROS  Renal/GU negative Renal ROS  negative genitourinary   Musculoskeletal   Abdominal   Peds  Hematology negative hematology ROS (+)   Anesthesia Other Findings   Reproductive/Obstetrics (+) Pregnancy                             Anesthesia Physical Anesthesia Plan  ASA: 2  Anesthesia Plan: Epidural   Post-op Pain Management:    Induction:   PONV Risk Score and Plan:   Airway Management Planned:   Additional Equipment:   Intra-op Plan:   Post-operative Plan:   Informed Consent: I have reviewed the patients History and Physical, chart, labs and discussed the procedure including the risks, benefits and alternatives for the proposed anesthesia with the patient or authorized representative who has indicated his/her understanding and acceptance.       Plan Discussed with: CRNA  Anesthesia Plan Comments:         Anesthesia Quick Evaluation

## 2022-04-10 NOTE — Progress Notes (Signed)
First push, movement noted, CNM at [facility]

## 2022-04-10 NOTE — Anesthesia Procedure Notes (Signed)
Epidural Patient location during procedure: OB  Staffing Anesthesiologist: Martha Clan, MD Resident/CRNA: Rolla Plate, CRNA Performed: resident/CRNA   Preanesthetic Checklist Completed: patient identified, IV checked, site marked, risks and benefits discussed, surgical consent, monitors and equipment checked, pre-op evaluation and timeout performed  Epidural Patient position: sitting Prep: ChloraPrep and site prepped and draped Patient monitoring: heart rate, continuous pulse ox and blood pressure Approach: midline Location: L4-L5 Injection technique: LOR saline  Needle:  Needle type: Tuohy  Needle gauge: 17 G Needle length: 9 cm Needle insertion depth: 8 cm Catheter type: closed end flexible Catheter size: 19 Gauge Catheter at skin depth: 13 cm Test dose: negative and 1.5% lidocaine with Epi 1:200 K  Assessment Events: blood not aspirated, injection not painful, no injection resistance, no paresthesia and negative IV test  Additional Notes   Patient tolerated the insertion well without complications.Reason for block:procedure for pain

## 2022-04-10 NOTE — Progress Notes (Signed)
SVD baby boy skin to skin with mother

## 2022-04-10 NOTE — Progress Notes (Addendum)
Stacey Harrison is stable after delivery. Her attentive husband, [name], is at bedside. They are bonding with newborn [facility] appropriately. She has eaten and tolerated a regular diet. Epidural catheter removed by RN, tip intact, no bleeding or tenderness noted at site. She ambulated to the bathroom with a steady gait, voided a sufficient amount, and tolerated activity well. RN assisted with initial shower after delivery. She preformed hygiene and dressing tasks with minimal assistance. Education provided on postpartum peri care. Ice pack, Dermoplast, and Tucks pads provided.

## 2022-04-10 NOTE — Progress Notes (Signed)
Labor Progress Note  Stacey Harrison is a 26 y.o. G2P1001 at 28w5dby ultrasound admitted for induction of labor due to Post dates. Due date 04/05/2022.  Subjective: She is comfortable after her epidural, reports some mild nausea  Objective: BP 96/61   Pulse (!) 103   Temp 98.3 F (36.8 C) (Oral)   Resp 18   LMP 06/29/2021   SpO2 95%  Notable VS details: reviewed, received ephedrine x1 after epidural placed  Fetal Assessment: FHT:  FHR: 135 bpm, variability: moderate,  accelerations:  Present,  decelerations:  Absent Category/reactivity:  Category I UC:   regular, every 1.5-3 minutes SVE:    Dilation: 5cm  Effacement: 80%  Station:  -2  Consistency: medium  Position: middle  Membrane status:SROM @ 1315 Amniotic color: clear  Labs: Lab Results  Component Value Date   WBC 10.5 04/10/2022   HGB 9.9 (L) 04/10/2022   HCT 29.9 (L) 04/10/2022   MCV 83.5 04/10/2022   PLT 206 04/10/2022    Assessment / Plan: Induction of labor due to post-dates,  progressing well on pitocin  Labor:  Received 2 doses of cytotec, switched to pitocin, SROM'd immediately prior to epidural placement, good labor progress Preeclampsia:  no signs or symptoms of toxicity Fetal Wellbeing:  Category I Pain Control:  Epidural I/D:   GBS negative, SROM x 1.5hrs Anticipated MOD:  NSVD  DGertie Fey CNM 04/10/2022, 2:46 PM

## 2022-04-10 NOTE — Discharge Summary (Signed)
Obstetrical Discharge Summary  Patient Name: Stacey Harrison DOB: [birth year] MRN: [id number]  Date of Admission: 04/10/2022 Date of Delivery: 04/10/2022 Delivered by: Lucrezia Europe, CNM Date of Discharge: 04/11/2022  Primary OB: [place]  KPT:[id number]'X last menstrual period was 06/29/2021. EDC Estimated Date of Delivery: 04/05/22 Gestational Age at Delivery: [redacted]w[redacted]d  Antepartum complications:  Anemia Hx of postpartum depression/anxiety  Admitting Diagnosis: IOL for post-dates Secondary Diagnosis: Patient Active Problem List   Diagnosis Date Noted   Encounter for planned induction of labor 04/10/2022   Labor and delivery, indication for care 04/05/2022   Anxiety 10/10/2019    Augmentation: Pitocin and Cytotec Complications: None Intrapartum complications/course: She arrived for IOL, received 2 doses cytotec then pitocin. SROM with light meconium. She progressed to 10/100/+2 and pushed for 21 minutes, delivering viable female infant over intact perineum.  Date of Delivery: 04/10/2022 Delivered By: DLucrezia Europe CNM Delivery Type: spontaneous vaginal delivery Anesthesia: epidural Placenta: spontaneous Laceration: none Episiotomy: none Newborn Data: Live born female "Luke" Birth Weight:  7lb 13.6oz APGAR: 8, 9  Newborn Delivery   Birth date/time: 04/10/2022 18:35:00 Delivery type: Vaginal, Spontaneous      Postpartum Procedures: none  Edinburgh:     04/11/2022    8:30 AM 03/08/2019   11:00 PM  Edinburgh Postnatal Depression Scale Screening Tool  I have been able to laugh and see the funny side of things. 0 0  I have looked forward with enjoyment to things. 0 0  I have blamed myself unnecessarily when things went wrong. 2 1  I have been anxious or worried for no good reason. 2 2  I have felt scared or panicky for no good reason. 2 1  Things have been getting on top of me. 1 1  I have been so unhappy that I have had difficulty sleeping. 0 0  I have  felt sad or miserable. 1 1  I have been so unhappy that I have been crying. 1 1  The thought of harming myself has occurred to me. 0 0  Edinburgh Postnatal Depression Scale Total 9 7      Post partum course:  Patient had an uncomplicated postpartum course.  By time of discharge on PPD#1, her pain was controlled on oral pain medications; she had appropriate lochia and was ambulating, voiding without difficulty and tolerating regular diet.  She was deemed stable for discharge to home.    Discharge Physical Exam:  BP 110/82 (BP Location: Left Arm)   Pulse 84   Temp 98.2 F (36.8 C) (Oral)   Resp 20   LMP 06/29/2021   SpO2 100%   Breastfeeding Unknown   General: NAD CV: RRR Pulm: CTABL, nl effort ABD: s/nd/nt, fundus firm and below the umbilicus Lochia: moderate Perineum: intact DVT Evaluation: LE non-ttp, no evidence of DVT on exam.  Hemoglobin  Date Value Ref Range Status  04/11/2022 9.6 (L) 12.0 - 15.0 g/dL Final  04/18/2019 12.6 11.1 - 15.9 g/dL Final   HCT  Date Value Ref Range Status  04/11/2022 29.7 (L) 36.0 - 46.0 % Final   Hematocrit  Date Value Ref Range Status  04/18/2019 37.4 34.0 - 46.6 % Final     Disposition: stable, discharge to home. Baby Feeding: breastmilk Baby Disposition: home with mom  Rh Immune globulin given: n/a, A pos Rubella vaccine given: immune Varicella vaccine given: immune Tdap vaccine given in AP or PP setting: 01/12/22 Flu vaccine given in AP or PP setting: n/a out of season  Contraception: husband to get vasectomy  Prenatal Labs:  Blood type/Rh A pos  Antibody screen neg  Rubella Immune  Varicella Immune  RPR NR  HBsAg Neg  HIV NR  GC neg  Chlamydia neg  Genetic screening cfDNA negative  1 hour GTT 140  3 hour GTT 85, 139, 111, 119  GBS negative     Plan:  TAWANDA SCHALL was discharged to home in good condition. Follow-up appointment with delivering provider in 6 weeks.  Discharge Medications: Allergies as  of 04/11/2022       Reactions   Hydrocodone Itching        Medication List     TAKE these medications    acetaminophen 325 MG tablet Commonly known as: Tylenol Take 2 tablets (650 mg total) by mouth every 4 (four) hours as needed (for pain scale < 4).   benzocaine-Menthol 20-0.5 % Aero Commonly known as: DERMOPLAST Apply 1 Application topically as needed for irritation (perineal discomfort).   coconut oil Oil Apply 1 Application topically as needed.   diphenhydrAMINE 25 mg capsule Commonly known as: BENADRYL Take 1 capsule (25 mg total) by mouth every 6 (six) hours as needed for itching.   escitalopram 10 MG tablet Commonly known as: LEXAPRO Take 10 mg by mouth daily.   ferrous sulfate 325 (65 FE) MG tablet Take 325 mg by mouth daily with breakfast.   ibuprofen 600 MG tablet Commonly known as: ADVIL Take 1 tablet (600 mg total) by mouth every 6 (six) hours.   magnesium 30 MG tablet Take 30 mg by mouth 2 (two) times daily.   Multi-Vitamin tablet Take 1 tablet by mouth daily.   senna-docusate 8.6-50 MG tablet Commonly known as: Senokot-S Take 2 tablets by mouth daily. Start taking on: April 12, 2022   SUMAtriptan 100 MG tablet Commonly known as: IMITREX TAKE 1 TABLET BY MOUTH ONCE AS NEEDED FOR MIGRAINE. MAY REPEAT IN 2 HRS IF HEADACHE PERSISTS   witch hazel-glycerin pad Commonly known as: TUCKS Apply 1 Application topically as needed for hemorrhoids.         Follow-up Information     Gertie Fey, CNM Follow up in 2 week(s).   Specialty: Certified Nurse Midwife Why: 2wk mood check Contact information: [place] [address] [phone]         Gertie Fey, CNM Follow up in 6 week(s).   Specialty: Certified Nurse Midwife Why: 6wk postpartum Contact information: [place] [address] [phone]                 Signed:  Francetta Found, [place] 04/11/2022 6:35 PM

## 2022-04-10 NOTE — Progress Notes (Signed)
Stacey Harrison ambulated to Mother/Baby Unit RM 340 with her husband and all belongings for couplet care. She was oriented to the care environment. All questions answered at this time. Report given to Vicente Males, Therapist, sports.

## 2022-04-10 NOTE — Progress Notes (Signed)
Labor Progress Note  Stacey Harrison is a 26 y.o. G2P1001 at 32w5dby ultrasound admitted for induction of labor due to Post dates. Due date 04/05/2022.  Subjective: she is feeling rectal pressure with her contractions  Objective: BP 96/61   Pulse (!) 103   Temp 98.3 F (36.8 C) (Oral)   Resp 18   LMP 06/29/2021   SpO2 95%  Notable VS details: reviewed  Fetal Assessment: FHT:  FHR: 130 bpm, variability: moderate,  accelerations:  Present,  decelerations:  Present recurrent early and variable decelerations Category/reactivity:  Category II UC:   regular, every 1-3 minutes SVE:    Dilation: 9.5cm  Effacement: 100%  Station:  +1  Consistency: medium  Position: anterior  Membrane status:SROM @ 1315 Amniotic color: light meconium  Labs: Lab Results  Component Value Date   WBC 10.5 04/10/2022   HGB 9.9 (L) 04/10/2022   HCT 29.9 (L) 04/10/2022   MCV 83.5 04/10/2022   PLT 206 04/10/2022    Assessment / Plan: Induction of labor due to post-dates,  progressing well on pitocin  Labor:  Progressing well on pitocin, good fetal descent with using "flying cowgirl" position, she is feeling increased rectal pressure, cervical lip present on left-side Preeclampsia:  intake and ouput balanced Fetal Wellbeing:  Category II Pain Control:  Epidural I/D:   GBS negative, SROM x 4.5hrs Anticipated MOD:  NSVD  DGertie Fey CNM 04/10/2022, 5:36 PM

## 2022-04-10 NOTE — Progress Notes (Signed)
Labor Progress Note  Stacey Harrison is a 26 y.o. G2P1001 at 65w5dby ultrasound admitted for induction of labor due to post-dates.  Subjective: she reports her contractions feel like "period cramps."  Objective: BP 115/72 (BP Location: Left Arm)   Pulse 92   Temp 97.9 F (36.6 C) (Axillary)   Resp 16   LMP 06/29/2021  Notable VS details: reviewed  Fetal Assessment: FHT:  FHR: 145 bpm, variability: moderate,  accelerations:  Present,  decelerations:  Absent Category/reactivity:  Category I UC:   regular, every 1-2 minutes SVE:    Dilation: 3cm  Effacement: 50%  Station:  Floating  Consistency: medium  Position: posterior  Membrane status:intact  Labs: Lab Results  Component Value Date   WBC 10.5 04/10/2022   HGB 9.9 (L) 04/10/2022   HCT 29.9 (L) 04/10/2022   MCV 83.5 04/10/2022   PLT 206 04/10/2022    Assessment / Plan: G2P1001 at 472w5dere for IOL for post-dates  Labor:  Has received 2 doses cytotec, last dose at 0645. Evaluated for cervical ripening balloon, but already 3cm and does not need ripening balloon. At 1045, can start pitocin. Preeclampsia:  no signs or symptoms of toxicity Fetal Wellbeing:  Category I Pain Control:   desires epidural I/D:  n/a Anticipated MOD:  NSVD  DaGertie FeyCNM 04/10/2022, 9:06 AM

## 2022-04-11 LAB — CBC
HCT: 29.7 % — ABNORMAL LOW (ref 36.0–46.0)
Hemoglobin: 9.6 g/dL — ABNORMAL LOW (ref 12.0–15.0)
MCH: 27.4 pg (ref 26.0–34.0)
MCHC: 32.3 g/dL (ref 30.0–36.0)
MCV: 84.9 fL (ref 80.0–100.0)
Platelets: 217 10*3/uL (ref 150–400)
RBC: 3.5 MIL/uL — ABNORMAL LOW (ref 3.87–5.11)
RDW: 16.4 % — ABNORMAL HIGH (ref 11.5–15.5)
WBC: 12.6 10*3/uL — ABNORMAL HIGH (ref 4.0–10.5)
nRBC: 0 % (ref 0.0–0.2)

## 2022-04-11 MED ORDER — DIPHENHYDRAMINE HCL 25 MG PO CAPS: 25.0000 mg | ORAL_CAPSULE | Freq: Four times a day (QID) | ORAL | 0 refills | Status: AC | PRN

## 2022-04-11 MED ORDER — ACETAMINOPHEN 325 MG PO TABS: 650.0000 mg | ORAL_TABLET | ORAL | Status: AC | PRN

## 2022-04-11 MED ORDER — SENNOSIDES-DOCUSATE SODIUM 8.6-50 MG PO TABS: 2.0000 | ORAL_TABLET | Freq: Every day | ORAL | 0 refills | Status: AC

## 2022-04-11 MED ORDER — COCONUT OIL OIL: 1.0000 | TOPICAL_OIL | 0 refills | Status: AC | PRN

## 2022-04-11 MED ORDER — WITCH HAZEL-GLYCERIN EX PADS: 1.0000 | MEDICATED_PAD | CUTANEOUS | 12 refills | Status: AC | PRN

## 2022-04-11 MED ORDER — BENZOCAINE-MENTHOL 20-0.5 % EX AERO: 1.0000 | INHALATION_SPRAY | CUTANEOUS | Status: AC | PRN

## 2022-04-11 MED ORDER — IBUPROFEN 600 MG PO TABS: 600.0000 mg | ORAL_TABLET | Freq: Four times a day (QID) | ORAL | 0 refills | Status: AC

## 2022-04-11 NOTE — Lactation Note (Signed)
This note was copied from a baby's chart. Lactation Consultation Note  Patient Name: Stacey Harrison [id number]'Z Date: 04/11/2022 Reason for consult: Initial assessment;Term;Exclusive pumping and bottle feeding Age:28 hours  Maternal Data This is mom's 2nd baby, deliverd NSVD. She reports she formula fed her first baby. Her plan is to exclusively breastpump to establish a milk supply and in the interim she is bottle feeding baby formula. She will provide expressed breastmilk by bottle and use formula as needed. She has a DEBP .  Per chart review mom with history of anemia, PPD/anxiety, and migraines. Does the patient have breastfeeding experience prior to this delivery?: No  Feeding Mother's Current Feeding Choice: Breast Milk and Formula   Interventions Interventions: DEBP;Education (Reviewed how to establish and maintain a milk supply when exclusively pumping. Reviewed pumping, collection, storage, and transport of breastmilk.)  Discharge Discharge Education: Engorgement and breast care;Other (comment) (Mom provided [facility] lactation office phone number if she needs additional help once she goes home.Mom will bring baby for follow-up at the [facility] clinic.) Pump: Personal (Mom has a [employer]ra double electric breastpump for home use.)  Consult Status Consult Status: PRN  Update provided to care nurse.  Jonna Madelynne Lasker 04/11/2022, 10:22 AM

## 2022-04-11 NOTE — Anesthesia Postprocedure Evaluation (Signed)
Anesthesia Post Note  Patient: Stacey Harrison  Procedure(s) Performed: AN AD [place]  Patient location during evaluation: Mother Baby Anesthesia Type: Epidural Level of consciousness: awake and alert Pain management: pain level controlled Vital Signs Assessment: post-procedure vital signs reviewed and stable Respiratory status: spontaneous breathing, nonlabored ventilation and respiratory function stable Cardiovascular status: stable Postop Assessment: no headache, no backache and epidural receding Anesthetic complications: no   No notable events documented.   Last Vitals:  Vitals:   04/10/22 2339 04/11/22 0359  BP: 123/83 112/78  Pulse: (!) 102 100  Resp: 20 20  Temp: 36.8 C 36.4 C  SpO2: 96% 99%    Last Pain:  Vitals:   04/11/22 0454  TempSrc:   PainSc: 2                  Nayquan Evinger Lorenza Chick

## 2022-04-11 NOTE — Discharge Summary (Signed)
Pt dc instructions and follow up appt reviewed. Prescriptions sent to pharmacy electronically. Pt verbalizes understanding of all dc instructions.pt dc'd to home via wc with infant in arms. Escorted out by [employer]. Placed in backseat by family member.

## 2022-04-11 NOTE — Progress Notes (Signed)
Post Partum Day 1 Subjective: Doing well, no complaints.  Tolerating regular diet, pain with PO meds, voiding and ambulating without difficulty.  No CP SOB Fever,Chills, N/V or leg pain; denies nipple or breast pain, no HA change of vision, RUQ/epigastric pain  Objective: BP 110/82 (BP Location: Left Arm)   Pulse 84   Temp 98.2 F (36.8 C) (Oral)   Resp 20   LMP 06/29/2021   SpO2 100%   Breastfeeding Unknown    Physical Exam:  General: NAD Breasts: soft/nontender CV: RRR Pulm: nl effort, CTABL Abdomen: soft, NT, BS x 4 Perineum: minimal edema, intact/lacerations hemostatic/repair well approximated Lochia: moderate Uterine Fundus: fundus firm and even with umbilicus DVT Evaluation: no cords, ttp LEs   Recent Labs    04/10/22 0203 04/11/22 0540  HGB 9.9* 9.6*  HCT 29.9* 29.7*  WBC 10.5 12.6*  PLT 206 217    Assessment/Plan: 26 y.o. G2P2002 postpartum day # 1  - Continue routine PP care - Lactation consult prn  - Discussed contraceptive options including implant, IUDs hormonal and non-hormonal, injection, pills/ring/patch, condoms, and NFP. Planning spouse vasectomy.  - Acute blood loss anemia - hemodynamically stable and asymptomatic; start po ferrous sulfate BID with stool softeners  - Immunization status: all Imms up to date    Disposition: Does desire Dc home today.     Francetta Found, CNM 04/11/2022  9:20 AM

## 2024-11-11 ENCOUNTER — Other Ambulatory Visit: Payer: Self-pay | Admitting: Neurology

## 2024-11-11 DIAGNOSIS — R200 Anesthesia of skin: Secondary | ICD-10-CM

## 2024-11-21 ENCOUNTER — Inpatient Hospital Stay: Admission: RE | Admit: 2024-11-21 | Source: Ambulatory Visit
